# Patient Record
Sex: Female | Born: 1965 | Race: White | Hispanic: No | Smoking: Current every day smoker
Health system: Southern US, Community
[De-identification: ages and names within clinical notes are randomized; demographics above are authoritative.]

## PROBLEM LIST (undated history)

## (undated) DIAGNOSIS — F329 Major depressive disorder, single episode, unspecified: Secondary | ICD-10-CM

## (undated) DIAGNOSIS — E119 Type 2 diabetes mellitus without complications: Secondary | ICD-10-CM

## (undated) DIAGNOSIS — Z720 Tobacco use: Secondary | ICD-10-CM

## (undated) DIAGNOSIS — F419 Anxiety disorder, unspecified: Secondary | ICD-10-CM

## (undated) DIAGNOSIS — F32A Depression, unspecified: Secondary | ICD-10-CM

## (undated) DIAGNOSIS — E079 Disorder of thyroid, unspecified: Secondary | ICD-10-CM

## (undated) DIAGNOSIS — I10 Essential (primary) hypertension: Secondary | ICD-10-CM

## (undated) HISTORY — PX: HYSTEROSCOPY: SHX211

## (undated) HISTORY — PX: TONSILLECTOMY: SUR1361

## (undated) HISTORY — PX: CYSTOSCOPY W/ DILATION OF BLADDER: SUR374

## (undated) HISTORY — PX: ENDOMETRIAL ABLATION: SHX621

---

## 2003-03-16 ENCOUNTER — Ambulatory Visit (HOSPITAL_COMMUNITY): Admission: RE | Admit: 2003-03-16 | Discharge: 2003-03-16 | Payer: Self-pay | Admitting: Occupational Therapy

## 2003-03-16 ENCOUNTER — Encounter: Payer: Self-pay | Admitting: Occupational Therapy

## 2003-04-22 ENCOUNTER — Ambulatory Visit (HOSPITAL_COMMUNITY): Admission: RE | Admit: 2003-04-22 | Discharge: 2003-04-22 | Payer: Self-pay | Admitting: Family Medicine

## 2003-04-22 ENCOUNTER — Encounter: Payer: Self-pay | Admitting: Family Medicine

## 2003-09-09 ENCOUNTER — Emergency Department (HOSPITAL_COMMUNITY)
Admission: EM | Admit: 2003-09-09 | Discharge: 2003-09-09 | Payer: Self-pay | Admitting: Anatomic Pathology & Clinical Pathology

## 2006-11-26 ENCOUNTER — Ambulatory Visit (HOSPITAL_COMMUNITY): Admission: RE | Admit: 2006-11-26 | Discharge: 2006-11-26 | Payer: Self-pay | Admitting: Family Medicine

## 2007-02-03 ENCOUNTER — Encounter (INDEPENDENT_AMBULATORY_CARE_PROVIDER_SITE_OTHER): Payer: Self-pay | Admitting: *Deleted

## 2007-02-03 ENCOUNTER — Ambulatory Visit (HOSPITAL_COMMUNITY): Admission: RE | Admit: 2007-02-03 | Discharge: 2007-02-03 | Payer: Self-pay | Admitting: Obstetrics & Gynecology

## 2008-11-01 ENCOUNTER — Ambulatory Visit (HOSPITAL_COMMUNITY): Admission: RE | Admit: 2008-11-01 | Discharge: 2008-11-01 | Payer: Self-pay | Admitting: Pulmonary Disease

## 2008-11-10 ENCOUNTER — Ambulatory Visit (HOSPITAL_COMMUNITY): Admission: RE | Admit: 2008-11-10 | Discharge: 2008-11-10 | Payer: Self-pay | Admitting: Pulmonary Disease

## 2009-08-08 ENCOUNTER — Emergency Department (HOSPITAL_COMMUNITY): Admission: EM | Admit: 2009-08-08 | Discharge: 2009-08-08 | Payer: Self-pay | Admitting: Emergency Medicine

## 2009-09-12 ENCOUNTER — Emergency Department (HOSPITAL_COMMUNITY): Admission: EM | Admit: 2009-09-12 | Discharge: 2009-09-12 | Payer: Self-pay | Admitting: Emergency Medicine

## 2009-12-28 ENCOUNTER — Emergency Department (HOSPITAL_COMMUNITY): Admission: EM | Admit: 2009-12-28 | Discharge: 2009-12-28 | Payer: Self-pay | Admitting: Emergency Medicine

## 2010-03-07 ENCOUNTER — Ambulatory Visit (HOSPITAL_COMMUNITY): Admission: RE | Admit: 2010-03-07 | Discharge: 2010-03-07 | Payer: Self-pay | Admitting: Pulmonary Disease

## 2010-05-26 ENCOUNTER — Emergency Department (HOSPITAL_COMMUNITY): Admission: EM | Admit: 2010-05-26 | Discharge: 2010-05-26 | Payer: Self-pay | Admitting: Emergency Medicine

## 2010-05-29 ENCOUNTER — Ambulatory Visit (HOSPITAL_COMMUNITY): Admission: RE | Admit: 2010-05-29 | Discharge: 2010-05-29 | Payer: Self-pay | Admitting: Pulmonary Disease

## 2010-06-26 ENCOUNTER — Emergency Department (HOSPITAL_COMMUNITY): Admission: EM | Admit: 2010-06-26 | Discharge: 2010-06-26 | Payer: Self-pay | Admitting: Emergency Medicine

## 2010-10-08 ENCOUNTER — Emergency Department (HOSPITAL_COMMUNITY): Admission: EM | Admit: 2010-10-08 | Discharge: 2010-10-08 | Payer: Self-pay | Admitting: Emergency Medicine

## 2011-01-22 ENCOUNTER — Emergency Department (HOSPITAL_COMMUNITY)
Admission: EM | Admit: 2011-01-22 | Discharge: 2011-01-22 | Disposition: A | Discharge status: Home / Self Care | Payer: Self-pay | Attending: Emergency Medicine | Admitting: Emergency Medicine

## 2011-01-22 DIAGNOSIS — M1990 Unspecified osteoarthritis, unspecified site: Secondary | ICD-10-CM | POA: Insufficient documentation

## 2011-01-22 DIAGNOSIS — X58XXXA Exposure to other specified factors, initial encounter: Secondary | ICD-10-CM | POA: Insufficient documentation

## 2011-01-22 DIAGNOSIS — I10 Essential (primary) hypertension: Secondary | ICD-10-CM | POA: Insufficient documentation

## 2011-01-22 DIAGNOSIS — IMO0002 Reserved for concepts with insufficient information to code with codable children: Secondary | ICD-10-CM | POA: Insufficient documentation

## 2011-01-22 DIAGNOSIS — E039 Hypothyroidism, unspecified: Secondary | ICD-10-CM | POA: Insufficient documentation

## 2011-01-22 DIAGNOSIS — M79609 Pain in unspecified limb: Secondary | ICD-10-CM | POA: Insufficient documentation

## 2011-01-22 DIAGNOSIS — G5600 Carpal tunnel syndrome, unspecified upper limb: Secondary | ICD-10-CM | POA: Insufficient documentation

## 2011-01-22 DIAGNOSIS — Z79899 Other long term (current) drug therapy: Secondary | ICD-10-CM | POA: Insufficient documentation

## 2011-01-31 LAB — URINALYSIS, ROUTINE W REFLEX MICROSCOPIC
Glucose, UA: NEGATIVE mg/dL
Hgb urine dipstick: NEGATIVE
Nitrite: NEGATIVE
Protein, ur: NEGATIVE mg/dL
Specific Gravity, Urine: 1.01 (ref 1.005–1.030)
Urobilinogen, UA: 1 mg/dL (ref 0.0–1.0)
pH: 6.5 (ref 5.0–8.0)

## 2011-01-31 LAB — CBC
MCV: 90.9 fL (ref 78.0–100.0)
Platelets: 197 10*3/uL (ref 150–400)
RBC: 4.76 MIL/uL (ref 3.87–5.11)
RDW: 13.1 % (ref 11.5–15.5)

## 2011-01-31 LAB — DIFFERENTIAL
Eosinophils Absolute: 0.1 10*3/uL (ref 0.0–0.7)
Lymphocytes Relative: 28 % (ref 12–46)
Lymphs Abs: 1.8 10*3/uL (ref 0.7–4.0)
Monocytes Absolute: 0.4 10*3/uL (ref 0.1–1.0)

## 2011-01-31 LAB — COMPREHENSIVE METABOLIC PANEL
AST: 19 U/L (ref 0–37)
Albumin: 3.4 g/dL — ABNORMAL LOW (ref 3.5–5.2)
Alkaline Phosphatase: 34 U/L — ABNORMAL LOW (ref 39–117)
BUN: 8 mg/dL (ref 6–23)
Calcium: 8.8 mg/dL (ref 8.4–10.5)
Sodium: 138 mEq/L (ref 135–145)

## 2011-01-31 LAB — PREGNANCY, URINE: Preg Test, Ur: NEGATIVE

## 2011-03-30 NOTE — Op Note (Signed)
NAMEMAYLEY, LISH             ACCOUNT NO.:  [id number]   MEDICAL RECORD NO.:  [id number]          PATIENT TYPE:  AMB   LOCATION:  DAY                           FACILITY:  [facility]   PHYSICIAN:  Lazaro Arms, M.D.   DATE OF BIRTH:  09/29/1966   DATE OF PROCEDURE:  02/03/2007  DATE OF DISCHARGE:                               OPERATIVE REPORT   PREOPERATIVE DIAGNOSES:  1. Menometrorrhagia.  2. Dysmenorrhea.   POSTOPERATIVE DIAGNOSES:  1. Menometrorrhagia.  2. Dysmenorrhea.   PROCEDURE:  Hysteroscopy, D&C, endometrial ablation.   SURGEON:  Dr. Despina Hidden   ANESTHESIA:  General endotracheal.   FINDINGS:  The patient had normal endometrial cavity.  No polyps,  fibroids, or submucosal myomas.   DESCRIPTION OF OPERATION:  The patient was taken to the operating room,  placed in supine position where she underwent general endotracheal  anesthesia, placed in dorsal lithotomy position, prepped and draped in  the usual sterile fashion.  Graves speculum was placed.  The cervix was  grasped.  It was dilated slowly to allow passage of the hysteroscope.  Diagnostic hysteroscopy was performed and was found to be normal.  A  vigorous uterine curettage was performed.  Good uterine cry in all  areas.  ThermaChoice 3 endometrial ablation balloon was used; 21 mL of  D5W was required to maintain a pressure of 192 mmHg.  It was heated to  87 degrees Celsius.  Total therapy time was 9 minutes and 45 seconds.  All the fluid was recovered at the end of the procedure.  The patient  tolerated the procedure well.  She experienced minimal blood loss and  was taken to the recovery room in good, stable condition.  All counts  were correct.      Lazaro Arms, M.D.  Electronically Signed     LHE/MEDQ  D:  02/03/2007  T:  02/03/2007  Job:  [id number]

## 2011-04-06 ENCOUNTER — Other Ambulatory Visit (HOSPITAL_COMMUNITY): Payer: Self-pay | Admitting: Pulmonary Disease

## 2011-04-06 DIAGNOSIS — Z139 Encounter for screening, unspecified: Secondary | ICD-10-CM

## 2011-04-13 ENCOUNTER — Ambulatory Visit (HOSPITAL_COMMUNITY)
Admission: RE | Admit: 2011-04-13 | Discharge: 2011-04-13 | Disposition: A | Discharge status: Home / Self Care | Payer: Medicaid Other | Source: Ambulatory Visit | Attending: Pulmonary Disease | Admitting: Pulmonary Disease

## 2011-04-13 DIAGNOSIS — Z1231 Encounter for screening mammogram for malignant neoplasm of breast: Secondary | ICD-10-CM | POA: Insufficient documentation

## 2011-04-13 DIAGNOSIS — Z139 Encounter for screening, unspecified: Secondary | ICD-10-CM

## 2011-05-09 ENCOUNTER — Other Ambulatory Visit (HOSPITAL_COMMUNITY): Payer: Self-pay | Admitting: Pulmonary Disease

## 2011-05-09 ENCOUNTER — Ambulatory Visit (HOSPITAL_COMMUNITY)
Admission: RE | Admit: 2011-05-09 | Discharge: 2011-05-09 | Disposition: A | Discharge status: Home / Self Care | Payer: Medicaid Other | Source: Ambulatory Visit | Attending: Pulmonary Disease | Admitting: Pulmonary Disease

## 2011-05-09 DIAGNOSIS — M25551 Pain in right hip: Secondary | ICD-10-CM

## 2011-05-09 DIAGNOSIS — M25559 Pain in unspecified hip: Secondary | ICD-10-CM | POA: Insufficient documentation

## 2011-08-12 ENCOUNTER — Emergency Department (HOSPITAL_COMMUNITY): Payer: Medicaid Other

## 2011-08-12 ENCOUNTER — Encounter: Payer: Self-pay | Admitting: *Deleted

## 2011-08-12 ENCOUNTER — Emergency Department (HOSPITAL_COMMUNITY)
Admission: EM | Admit: 2011-08-12 | Discharge: 2011-08-12 | Disposition: A | Discharge status: Home / Self Care | Payer: Medicaid Other | Attending: Emergency Medicine | Admitting: Emergency Medicine

## 2011-08-12 DIAGNOSIS — R059 Cough, unspecified: Secondary | ICD-10-CM | POA: Insufficient documentation

## 2011-08-12 DIAGNOSIS — F17200 Nicotine dependence, unspecified, uncomplicated: Secondary | ICD-10-CM | POA: Insufficient documentation

## 2011-08-12 DIAGNOSIS — R05 Cough: Secondary | ICD-10-CM | POA: Insufficient documentation

## 2011-08-12 DIAGNOSIS — J40 Bronchitis, not specified as acute or chronic: Secondary | ICD-10-CM

## 2011-08-12 DIAGNOSIS — R062 Wheezing: Secondary | ICD-10-CM | POA: Insufficient documentation

## 2011-08-12 MED ORDER — ALBUTEROL SULFATE HFA 108 (90 BASE) MCG/ACT IN AERS
2.0000 | INHALATION_SPRAY | Freq: Once | RESPIRATORY_TRACT | Status: AC
  Administered 2011-08-12: 2 via RESPIRATORY_TRACT
  Filled 2011-08-12: qty 6.7

## 2011-08-12 MED ORDER — AZITHROMYCIN 250 MG PO TABS: 250.0000 mg | ORAL_TABLET | Freq: Every day | ORAL | Status: AC

## 2011-08-12 NOTE — ED Notes (Signed)
Pt reports sinus congestion and productive cough starting 4 days ago

## 2011-08-12 NOTE — ED Provider Notes (Signed)
Scribed for Maria Lennert, MD, the patient was seen in room APA01/APA01 . This chart was scribed by Ellie Lunch. This patient's care was started at 8:51 PM.   CSN: [id number] Arrival date & time: 08/12/2011  7:59 PM  Chief Complaint  Patient presents with  . Cough    (Consider location/radiation/quality/duration/timing/severity/associated sxs/prior treatment) HPI PT seen at 20:51 Maria Vargas is a 45 y.o. female who presents to the Emergency Department complaining of productive cough with associated congestion, wheezing, and SOB starting 4 days ago. Cough is described as productive with yellow phlegm. Pt reports cough has become progressively worse. Pt is a smoker with no hx of emphysema.   Past Medical History  Diagnosis Date  . Asthma     Past Surgical History  Procedure Date  . Tonsillectomy   . Cesarean section   . Endometrial ablation     No family history on file.  History  Substance Use Topics  . Smoking status: Current Everyday Smoker  . Smokeless tobacco: Not on file  . Alcohol Use: No    Review of Systems  Constitutional: Negative for fatigue.  HENT: Positive for congestion. Negative for sinus pressure and ear discharge.   Eyes: Negative for discharge.  Respiratory: Positive for cough.   Cardiovascular: Negative for chest pain.  Gastrointestinal: Negative for abdominal pain and diarrhea.  Genitourinary: Negative for frequency and hematuria.  Musculoskeletal: Negative for back pain.  Skin: Negative for rash.  Neurological: Negative for seizures and headaches.  Hematological: Negative.   Psychiatric/Behavioral: Negative for hallucinations.    Allergies  Augmentin  Home Medications  No current outpatient prescriptions on file.  BP 151/74  Pulse 83  Temp(Src) 98.9 F (37.2 C) (Oral)  Resp 20  Ht 4\' 10"  (1.473 m)  Wt 185 lb (83.915 kg)  BMI 38.66 kg/m2  SpO2 99%  Physical Exam  Nursing note and vitals reviewed. Constitutional: She  appears well-developed and well-nourished. No distress.  HENT:  Head: Normocephalic and atraumatic.  Right Ear: External ear normal.  Left Ear: External ear normal.  Eyes: Conjunctivae are normal. Right eye exhibits no discharge. Left eye exhibits no discharge. No scleral icterus.  Neck: Neck supple. No tracheal deviation present.  Cardiovascular: Normal rate, regular rhythm and intact distal pulses.   Pulmonary/Chest: Effort normal. No stridor. No respiratory distress. She has wheezes (monomal wheezing bilaterally). She has no rales.  Abdominal: Soft. Bowel sounds are normal. She exhibits no distension. There is no tenderness. There is no rebound and no guarding.  Musculoskeletal: She exhibits no edema and no tenderness.  Neurological: She is alert. She has normal strength. No sensory deficit. Cranial nerve deficit:  no gross defecits noted. She exhibits normal muscle tone. She displays no seizure activity. Coordination normal.  Skin: Skin is warm and dry. No rash noted.  Psychiatric: She has a normal mood and affect.   Procedures (including critical care time)  OTHER DATA REVIEWED: Nursing notes, vital signs, and past medical records reviewed.  DIAGNOSTIC STUDIES: Oxygen Saturation is 99% on room air, normal by my interpretation.    LABS / RADIOLOGY:  Dg Chest 2 View  08/12/2011  *RADIOLOGY REPORT*  Clinical Data: Productive cough and wheezing.  CHEST - 2 VIEW  Comparison: None  Findings: The cardiac silhouette, mediastinal and hilar contours are within normal limits.  The lungs are clear except for minimal streaky bibasilar atelectasis.  No effusions or edema.  The bony thorax is intact.  IMPRESSION: Streaky bibasilar atelectasis but no infiltrates  or effusions.  Original Report Authenticated By: P. Loralie Champagne, M.D.    ED COURSE / COORDINATION OF CARE: 20:55 EDP at PT bedside. Discussed imaging results not indicating pneumonia at this time. Discussed plan to discharge with abx and  albuterol inhaler prescriptions and F/u with PCP Dr. Juanetta Gosling.   bronchitis  The chart was scribed for me under my direct supervision.  I personally performed the history, physical, and medical decision making and all procedures in the evaluation of this patient.Maria Lennert, MD 08/12/11 2102

## 2011-08-12 NOTE — ED Notes (Signed)
Pt a/ox4. Resp even and unlabored. NAD at this time. D/C instructions and Rx reviewed with pt. Pt verbalized understanding. Pt ambulated to POV with steady gate and husband to transport home.

## 2011-08-31 ENCOUNTER — Other Ambulatory Visit (HOSPITAL_COMMUNITY): Payer: Self-pay | Admitting: Pulmonary Disease

## 2011-08-31 ENCOUNTER — Ambulatory Visit (HOSPITAL_COMMUNITY)
Admission: RE | Admit: 2011-08-31 | Discharge: 2011-08-31 | Disposition: A | Discharge status: Home / Self Care | Payer: Medicaid Other | Source: Ambulatory Visit | Attending: Pulmonary Disease | Admitting: Pulmonary Disease

## 2011-08-31 DIAGNOSIS — J40 Bronchitis, not specified as acute or chronic: Secondary | ICD-10-CM | POA: Insufficient documentation

## 2011-08-31 DIAGNOSIS — J189 Pneumonia, unspecified organism: Secondary | ICD-10-CM | POA: Insufficient documentation

## 2012-08-05 ENCOUNTER — Other Ambulatory Visit (HOSPITAL_COMMUNITY): Payer: Self-pay | Admitting: Pulmonary Disease

## 2012-08-05 DIAGNOSIS — Z1231 Encounter for screening mammogram for malignant neoplasm of breast: Secondary | ICD-10-CM

## 2012-08-06 ENCOUNTER — Other Ambulatory Visit (HOSPITAL_COMMUNITY): Payer: Self-pay | Admitting: Pulmonary Disease

## 2012-08-06 DIAGNOSIS — Z1231 Encounter for screening mammogram for malignant neoplasm of breast: Secondary | ICD-10-CM

## 2012-08-11 ENCOUNTER — Ambulatory Visit (HOSPITAL_COMMUNITY)
Admission: RE | Admit: 2012-08-11 | Discharge: 2012-08-11 | Disposition: A | Discharge status: Home / Self Care | Payer: Self-pay | Source: Ambulatory Visit | Attending: Pulmonary Disease | Admitting: Pulmonary Disease

## 2012-08-11 DIAGNOSIS — Z1231 Encounter for screening mammogram for malignant neoplasm of breast: Secondary | ICD-10-CM

## 2013-07-17 ENCOUNTER — Other Ambulatory Visit (HOSPITAL_COMMUNITY): Payer: Self-pay | Admitting: Pulmonary Disease

## 2013-07-17 DIAGNOSIS — Z139 Encounter for screening, unspecified: Secondary | ICD-10-CM

## 2013-08-14 ENCOUNTER — Ambulatory Visit (HOSPITAL_COMMUNITY)
Admission: RE | Admit: 2013-08-14 | Discharge: 2013-08-14 | Disposition: A | Discharge status: Home / Self Care | Payer: Medicaid Other | Source: Ambulatory Visit | Attending: Pulmonary Disease | Admitting: Pulmonary Disease

## 2013-08-14 DIAGNOSIS — Z139 Encounter for screening, unspecified: Secondary | ICD-10-CM

## 2013-08-14 DIAGNOSIS — Z1231 Encounter for screening mammogram for malignant neoplasm of breast: Secondary | ICD-10-CM | POA: Insufficient documentation

## 2014-04-29 ENCOUNTER — Emergency Department (HOSPITAL_COMMUNITY)
Admission: EM | Admit: 2014-04-29 | Discharge: 2014-04-29 | Disposition: A | Discharge status: Home / Self Care | Payer: Medicaid Other | Attending: Emergency Medicine | Admitting: Emergency Medicine

## 2014-04-29 ENCOUNTER — Encounter (HOSPITAL_COMMUNITY): Payer: Self-pay | Admitting: Emergency Medicine

## 2014-04-29 DIAGNOSIS — R1110 Vomiting, unspecified: Secondary | ICD-10-CM | POA: Insufficient documentation

## 2014-04-29 DIAGNOSIS — J029 Acute pharyngitis, unspecified: Secondary | ICD-10-CM | POA: Insufficient documentation

## 2014-04-29 DIAGNOSIS — J45909 Unspecified asthma, uncomplicated: Secondary | ICD-10-CM | POA: Insufficient documentation

## 2014-04-29 DIAGNOSIS — Z79899 Other long term (current) drug therapy: Secondary | ICD-10-CM | POA: Insufficient documentation

## 2014-04-29 DIAGNOSIS — F17200 Nicotine dependence, unspecified, uncomplicated: Secondary | ICD-10-CM | POA: Insufficient documentation

## 2014-04-29 MED ORDER — CEPHALEXIN 500 MG PO CAPS
500.0000 mg | ORAL_CAPSULE | Freq: Once | ORAL | Status: AC
  Administered 2014-04-29: 500 mg via ORAL
  Filled 2014-04-29: qty 1

## 2014-04-29 MED ORDER — ACETAMINOPHEN 500 MG PO TABS
1000.0000 mg | ORAL_TABLET | Freq: Once | ORAL | Status: AC
  Administered 2014-04-29: 1000 mg via ORAL
  Filled 2014-04-29: qty 2

## 2014-04-29 MED ORDER — CEPHALEXIN 500 MG PO CAPS: 500.0000 mg | ORAL_CAPSULE | Freq: Four times a day (QID) | ORAL | Status: DC

## 2014-04-29 NOTE — ED Provider Notes (Signed)
CSN: [id number]     Arrival date & time 04/29/14  2016 History   First MD Initiated Contact with Patient 04/29/14 2129     Chief Complaint  Patient presents with  . Sore Throat     (Consider location/radiation/quality/duration/timing/severity/associated sxs/prior Treatment) HPI Comments: Patient is a 48 year old female who presents to the emergency department with complaint of sore throat. The patient states that this started about 3 days ago. She has had some low-grade temperature elevations, a few chills, and mild abdomen pain, but no rash or other symptoms. The patient states that she was exposed to strep as one of the family members had strep and she was around and did not know that she was carrying strep infection.  The patient also wanted to past about uncontrolled gagging and at times vomiting. The patient states that sometimes when she smells certain things or sometimes even when she eats different things that she has gagging, and or vomiting. She's not had any injury to the abdomen. She's not had any recent surgical procedures. At times the problem occurs when she smells things, and at times it occurs after she has eaten. There is no particular pattern to this nausea, vomiting, or gagging. There's been no blood involved.  Patient is a 48 y.o. female presenting with pharyngitis. The history is provided by the patient.  Sore Throat Associated symptoms include a sore throat and vomiting. Pertinent negatives include no abdominal pain, arthralgias, chest pain, coughing or neck pain.    Past Medical History  Diagnosis Date  . Asthma    Past Surgical History  Procedure Laterality Date  . Tonsillectomy    . Cesarean section    . Endometrial ablation     History reviewed. No pertinent family history. History  Substance Use Topics  . Smoking status: Current Every Day Smoker  . Smokeless tobacco: Not on file  . Alcohol Use: No   OB History   Grav Para Term Preterm Abortions TAB SAB  Ect Mult Living                 Review of Systems  Constitutional: Negative for activity change.       All ROS Neg except as noted in HPI  HENT: Positive for sore throat. Negative for nosebleeds.   Eyes: Negative for photophobia and discharge.  Respiratory: Negative for cough, shortness of breath and wheezing.   Cardiovascular: Negative for chest pain and palpitations.  Gastrointestinal: Positive for vomiting. Negative for abdominal pain and blood in stool.  Genitourinary: Negative for dysuria, frequency and hematuria.  Musculoskeletal: Negative for arthralgias, back pain and neck pain.  Skin: Negative.   Neurological: Negative for dizziness, seizures and speech difficulty.  Psychiatric/Behavioral: Negative for hallucinations and confusion.      Allergies  Amoxicillin-pot clavulanate  Home Medications   Prior to Admission medications   Medication Sig Start Date End Date Taking? Authorizing Provider  Cyanocobalamin (B-12 PO) Take 1 tablet by mouth daily.   Yes Historical Provider, MD  Fish Oil-Cholecalciferol (FISH OIL + D3 PO) Take 1 capsule by mouth daily.   Yes Historical Provider, MD  levothyroxine (SYNTHROID, LEVOTHROID) 50 MCG tablet Take 50 mcg by mouth daily.     Yes Historical Provider, MD  megestrol (MEGACE) 40 MG tablet Take 40 mg by mouth daily.   Yes Historical Provider, MD  sertraline (ZOLOFT) 100 MG tablet Take 100 mg by mouth daily.   Yes Historical Provider, MD  spironolactone (ALDACTONE) 25 MG tablet Take 25 mg  by mouth daily.     Yes Historical Provider, MD   BP 155/88  Pulse 104  Temp(Src) 99.1 F (37.3 C) (Oral)  Resp 20  Ht 4\' 10"  (1.473 m)  Wt 218 lb 9 oz (99.139 kg)  BMI 45.69 kg/m2  SpO2 98% Physical Exam  Nursing note and vitals reviewed. Constitutional: She is oriented to person, place, and time. She appears well-developed and well-nourished.  Non-toxic appearance.  HENT:  Head: Normocephalic.  Right Ear: Tympanic membrane and external ear  normal.  Left Ear: Tympanic membrane and external ear normal.  There is increased redness and some swelling of the uvula. There is increased redness of the posterior pharynx. The airway is patent.  The tympanic membranes are without problem.  Eyes: EOM and lids are normal. Pupils are equal, round, and reactive to light.  Neck: Normal range of motion. Neck supple. Carotid bruit is not present.  Cardiovascular: Normal rate, regular rhythm, normal heart sounds, intact distal pulses and normal pulses.   Pulmonary/Chest: Breath sounds normal. No respiratory distress.  Abdominal: Soft. Bowel sounds are normal. There is no tenderness. There is no guarding.  Musculoskeletal: Normal range of motion.  Lymphadenopathy:       Head (right side): No submandibular adenopathy present.       Head (left side): No submandibular adenopathy present.    She has no cervical adenopathy.  Neurological: She is alert and oriented to person, place, and time. She has normal strength. No cranial nerve deficit or sensory deficit.  Skin: Skin is warm and dry. No rash noted.  Psychiatric: She has a normal mood and affect. Her speech is normal.    ED Course  Procedures (including critical care time) Labs Review Labs Reviewed - No data to display  Imaging Review No results found.   EKG Interpretation None      MDM Patient has increased redness of the posterior pharynx, there is some swelling of the uvula. The airway is patent. The patient will be treated with Keflex 4 times a day. Patient will also be advised to use salt water gargles.  The patient's abdomen is soft with good bowel sounds. No evidence of a surgical abdomen at this time. I have advised the patient to keep a journal of her vomiting episodes, along with what she has been exposed to either pass female or things that she has eaten open her mouth. Her father*to present this information in detail to her physicians so that the proper workup can begin.     Final diagnoses:  None    **I have reviewed nursing notes, vital signs, and all appropriate lab and imaging results for this patient.Kathie Dike*    Hobson M Bryant, PA-C 04/29/14 2145

## 2014-04-29 NOTE — ED Notes (Signed)
Pt states she has been gagging and throwing up a little at random for the last month and that she was exposed to strep throat on Monday. She states that her throat is hurting but denies fever greater than 99.

## 2014-04-29 NOTE — ED Provider Notes (Signed)
Medical screening examination/treatment/procedure(s) were performed by non-physician practitioner and as supervising physician I was immediately available for consultation/collaboration.   EKG Interpretation None      Devoria AlbeIva Knapp, MD, Armando GangFACEP   Ward GivensIva L Knapp, MD 04/29/14 2300

## 2014-04-29 NOTE — Discharge Instructions (Signed)
Please use salt water gargles 3-4 times daily. Please wash hands frequently. Please use Keflex 4 times daily with food. Please keep a list of the vomiting episodes, please also keep a list of what you are exposed to at that time whether it be smells, something major eating, or other changes such as sleep, rest, stress, etc. Please present this information to your physician for appropriate workup. Pharyngitis Pharyngitis is a sore throat (pharynx). There is redness, pain, and swelling of your throat. HOME CARE   Drink enough fluids to keep your pee (urine) clear or pale yellow.  Only take medicine as told by your doctor.  You may get sick again if you do not take medicine as told. Finish your medicines, even if you start to feel better.  Do not take aspirin.  Rest.  Rinse your mouth (gargle) with salt water ( tsp of salt per 1 qt of water) every 1-2 hours. This will help the pain.  If you are not at risk for choking, you can suck on hard candy or sore throat lozenges. GET HELP IF:  You have large, tender lumps on your neck.  You have a rash.  You cough up green, yellow-brown, or bloody spit. GET HELP RIGHT AWAY IF:   You have a stiff neck.  You drool or cannot swallow liquids.  You throw up (vomit) or are not able to keep medicine or liquids down.  You have very bad pain that does not go away with medicine.  You have problems breathing (not from a stuffy nose). MAKE SURE YOU:   Understand these instructions.  Will watch your condition.  Will get help right away if you are not doing well or get worse. Document Released: 04/16/2008 Document Revised: 08/19/2013 Document Reviewed: 07/06/2013 [facility] Patient Information 2015 [employer], [place]. This information is not intended to replace advice given to you by your health care provider. Make sure you discuss any questions you have with your health care provider.

## 2014-04-29 NOTE — ED Notes (Signed)
Uncontrollable gagging for the past few months and I will vomit for no reason. Monday I was exposed to strep throat per pt.

## 2015-02-24 ENCOUNTER — Ambulatory Visit (HOSPITAL_COMMUNITY)
Admission: RE | Admit: 2015-02-24 | Discharge: 2015-02-24 | Disposition: A | Discharge status: Home / Self Care | Payer: Medicaid Other | Source: Ambulatory Visit | Attending: Pulmonary Disease | Admitting: Pulmonary Disease

## 2015-02-24 ENCOUNTER — Other Ambulatory Visit (HOSPITAL_COMMUNITY): Payer: Self-pay | Admitting: Pulmonary Disease

## 2015-02-24 DIAGNOSIS — M542 Cervicalgia: Secondary | ICD-10-CM

## 2016-10-31 ENCOUNTER — Encounter (HOSPITAL_COMMUNITY): Payer: Self-pay | Admitting: *Deleted

## 2016-10-31 ENCOUNTER — Emergency Department (HOSPITAL_COMMUNITY): Payer: Self-pay

## 2016-10-31 ENCOUNTER — Emergency Department (HOSPITAL_COMMUNITY)
Admission: EM | Admit: 2016-10-31 | Discharge: 2016-10-31 | Disposition: A | Discharge status: Home / Self Care | Payer: Self-pay | Attending: Emergency Medicine | Admitting: Emergency Medicine

## 2016-10-31 DIAGNOSIS — F17200 Nicotine dependence, unspecified, uncomplicated: Secondary | ICD-10-CM | POA: Insufficient documentation

## 2016-10-31 DIAGNOSIS — J45909 Unspecified asthma, uncomplicated: Secondary | ICD-10-CM | POA: Insufficient documentation

## 2016-10-31 DIAGNOSIS — I10 Essential (primary) hypertension: Secondary | ICD-10-CM | POA: Insufficient documentation

## 2016-10-31 DIAGNOSIS — Y999 Unspecified external cause status: Secondary | ICD-10-CM | POA: Insufficient documentation

## 2016-10-31 DIAGNOSIS — Y9301 Activity, walking, marching and hiking: Secondary | ICD-10-CM | POA: Insufficient documentation

## 2016-10-31 DIAGNOSIS — S92425A Nondisplaced fracture of distal phalanx of left great toe, initial encounter for closed fracture: Secondary | ICD-10-CM | POA: Insufficient documentation

## 2016-10-31 DIAGNOSIS — W1839XA Other fall on same level, initial encounter: Secondary | ICD-10-CM | POA: Insufficient documentation

## 2016-10-31 DIAGNOSIS — Z79899 Other long term (current) drug therapy: Secondary | ICD-10-CM | POA: Insufficient documentation

## 2016-10-31 DIAGNOSIS — Y929 Unspecified place or not applicable: Secondary | ICD-10-CM | POA: Insufficient documentation

## 2016-10-31 HISTORY — DX: Disorder of thyroid, unspecified: E07.9

## 2016-10-31 HISTORY — DX: Essential (primary) hypertension: I10

## 2016-10-31 MED ORDER — OXYCODONE-ACETAMINOPHEN 5-325 MG PO TABS
1.0000 | ORAL_TABLET | Freq: Once | ORAL | Status: AC
  Administered 2016-10-31: 1 via ORAL
  Filled 2016-10-31: qty 1

## 2016-10-31 MED ORDER — NAPROXEN 250 MG PO TABS
500.0000 mg | ORAL_TABLET | Freq: Once | ORAL | Status: AC
  Administered 2016-10-31: 500 mg via ORAL
  Filled 2016-10-31: qty 2

## 2016-10-31 MED ORDER — NAPROXEN 500 MG PO TABS: ORAL_TABLET | ORAL | 0 refills | Status: DC

## 2016-10-31 MED ORDER — OXYCODONE-ACETAMINOPHEN 5-325 MG PO TABS: 1.0000 | ORAL_TABLET | Freq: Four times a day (QID) | ORAL | 0 refills | Status: DC | PRN

## 2016-10-31 NOTE — ED Notes (Signed)
Awaiting dc papers from the doctor at this time

## 2016-10-31 NOTE — ED Triage Notes (Signed)
Pt was walking outside yesterday, stepped in a hole causing her left great toe to bend backwards, left great toe bruised, swollen,

## 2016-10-31 NOTE — ED Provider Notes (Signed)
[facility]-EMERGENCY DEPT Provider Note   CSN: [id number] Arrival date & time: 10/31/16  0217   Time seen 2:40 AM  History   Chief Complaint Chief Complaint  Patient presents with  . Toe Injury    HPI Maria Vargas is a 50 y.o. female.  HPI patient states about 4 PM she was walking and didn't realize there is a ditch until she stepped into it and she fell forward. She states the ditch was full of leaves and when she stepped she thought it was level ground and she fell into the hole. She states she hurt her left great toe. She states the pain is getting worse as time is gone. Denies injuring anything else other than having a superficial abrasion of her left knee.   PCP Dr Juanetta GoslingHawkins  Past Medical History:  Diagnosis Date  . Asthma   . Hypertension   . Thyroid disease     There are no active problems to display for this patient.   Past Surgical History:  Procedure Laterality Date  . CESAREAN SECTION    . ENDOMETRIAL ABLATION    . TONSILLECTOMY      OB History    No data available       Home Medications    Prior to Admission medications   Medication Sig Start Date End Date Taking? Authorizing Provider  Fish Oil-Cholecalciferol (FISH OIL + D3 PO) Take 1 capsule by mouth daily.   Yes Historical Provider, MD  levothyroxine (SYNTHROID, LEVOTHROID) 50 MCG tablet Take 50 mcg by mouth daily.     Yes Historical Provider, MD  megestrol (MEGACE) 40 MG tablet Take 20 mg by mouth daily.    Yes Historical Provider, MD  sertraline (ZOLOFT) 100 MG tablet Take 200 mg by mouth daily.    Yes Historical Provider, MD  spironolactone (ALDACTONE) 25 MG tablet Take 25 mg by mouth daily.     Yes Historical Provider, MD  cephALEXin (KEFLEX) 500 MG capsule Take 1 capsule (500 mg total) by mouth 4 (four) times daily. 04/29/14   Ivery QualeHobson Bryant, PA-C  Cyanocobalamin (B-12 PO) Take 1 tablet by mouth daily.    Historical Provider, MD  naproxen (NAPROSYN) 500 MG tablet Take 1 po BID with food prn  pain 10/31/16   Devoria AlbeIva Jariana Shumard, MD  oxyCODONE-acetaminophen (PERCOCET/ROXICET) 5-325 MG tablet Take 1 tablet by mouth every 6 (six) hours as needed for severe pain. 10/31/16   Devoria AlbeIva Brekyn Huntoon, MD    Family History No family history on file.  Social History Social History  Substance Use Topics  . Smoking status: Current Every Day Smoker  . Smokeless tobacco: Never Used  . Alcohol use No  Unemployed   Allergies   Amoxicillin-pot clavulanate   Review of Systems Review of Systems  All other systems reviewed and are negative.    Physical Exam Updated Vital Signs BP 158/77 (BP Location: Right Arm)   Pulse 100   Temp 97.6 F (36.4 C) (Oral)   Resp 20   SpO2 97%   Vital signs normal    Physical Exam  Constitutional: She appears well-developed and well-nourished.  HENT:  Head: Normocephalic and atraumatic.  Right Ear: External ear normal.  Left Ear: External ear normal.  Nose: Nose normal.  Eyes: Conjunctivae and EOM are normal.  Neck: Neck supple.  Cardiovascular: Normal rate.   Pulmonary/Chest: Effort normal. No respiratory distress.  Musculoskeletal: She exhibits edema and tenderness.  Patient is noted to have diffuse swelling and some bruising of her left  great toe especially just distal to the IP joint.     ED Treatments / Results    Radiology Dg Foot Complete Left  Result Date: 10/31/2016 CLINICAL DATA:  50 year old female with pain in the left great toe. EXAM: LEFT FOOT - COMPLETE 3+ VIEW COMPARISON:  None. FINDINGS: There is a nondisplaced fracture of the dorsal corner of the base of the distal phalanx of the great toe. There is extension of the fracture into the interphalangeal articular surface. No other acute fracture identified. There is no dislocation. No arthritic changes. The bones are well mineralized. There is soft tissue swelling of the fifth toe. No radiopaque foreign object IMPRESSION: Nondisplaced inter articular fracture of the dorsal aspect of the base  of the distal phalanx of the great toe. Electronically Signed   By: Elgie CollardArash  Radparvar M.D.   On: 10/31/2016 02:58    Procedures Procedures (including critical care time)  Medications Ordered in ED Medications  oxyCODONE-acetaminophen (PERCOCET/ROXICET) 5-325 MG per tablet 1 tablet (1 tablet Oral Given 10/31/16 0346)  naproxen (NAPROSYN) tablet 500 mg (500 mg Oral Given 10/31/16 0347)     Initial Impression / Assessment and Plan / ED Course  I have reviewed the triage vital signs and the nursing notes.  Pertinent labs & imaging results that were available during my care of the patient were reviewed by me and considered in my medical decision making (see chart for details).  Clinical Course    Patient had x-ray obtained of her left great toe. She is noted to have a fracture. We reviewed her x-rays. Patient was given oral pain medication and her toes were buddy taped. She was placed in a postop shoe. She did require crutches for comfort.   [place] shows no entries in the past 6 months  Final Clinical Impressions(s) / ED Diagnoses   Final diagnoses:  Nondisplaced fracture of distal phalanx of left great toe, initial encounter for closed fracture    New Prescriptions New Prescriptions   NAPROXEN (NAPROSYN) 500 MG TABLET    Take 1 po BID with food prn pain   OXYCODONE-ACETAMINOPHEN (PERCOCET/ROXICET) 5-325 MG TABLET    Take 1 tablet by mouth every 6 (six) hours as needed for severe pain.    Plan discharge  Devoria AlbeIva Leiyah Maultsby, MD, Concha PyoFACEP    Miro Balderson, MD 10/31/16 [phone]

## 2016-10-31 NOTE — Discharge Instructions (Addendum)
Elevate your foot. Use the ice packs for comfort and to reduce swelling. Take the medications as prescribed. I am limited by law how many pain pills I can prescribe, you will need to see your PCP or the orthopedist if you need more. Wear the post op shoe for comfort. Keep the toes buddy taped to support the fractured toe. Use the crutches until you are able to walk in the postop shoe without them. Call Dr Harrison's office to be rechecked in the next week.

## 2017-11-07 ENCOUNTER — Encounter (HOSPITAL_COMMUNITY): Payer: Self-pay

## 2017-11-07 ENCOUNTER — Emergency Department (HOSPITAL_COMMUNITY)
Admission: EM | Admit: 2017-11-07 | Discharge: 2017-11-07 | Disposition: A | Discharge status: Home / Self Care | Payer: Self-pay | Attending: Emergency Medicine | Admitting: Emergency Medicine

## 2017-11-07 ENCOUNTER — Other Ambulatory Visit: Payer: Self-pay

## 2017-11-07 DIAGNOSIS — F17200 Nicotine dependence, unspecified, uncomplicated: Secondary | ICD-10-CM | POA: Insufficient documentation

## 2017-11-07 DIAGNOSIS — J45909 Unspecified asthma, uncomplicated: Secondary | ICD-10-CM | POA: Insufficient documentation

## 2017-11-07 DIAGNOSIS — I10 Essential (primary) hypertension: Secondary | ICD-10-CM | POA: Insufficient documentation

## 2017-11-07 DIAGNOSIS — Z79899 Other long term (current) drug therapy: Secondary | ICD-10-CM | POA: Insufficient documentation

## 2017-11-07 DIAGNOSIS — M25572 Pain in left ankle and joints of left foot: Secondary | ICD-10-CM | POA: Insufficient documentation

## 2017-11-07 HISTORY — DX: Depression, unspecified: F32.A

## 2017-11-07 HISTORY — DX: Anxiety disorder, unspecified: F41.9

## 2017-11-07 HISTORY — DX: Major depressive disorder, single episode, unspecified: F32.9

## 2017-11-07 MED ORDER — COLCHICINE 0.6 MG PO TABS
1.2000 mg | ORAL_TABLET | Freq: Once | ORAL | Status: AC
  Administered 2017-11-07: 1.2 mg via ORAL

## 2017-11-07 MED ORDER — COLCHICINE 0.6 MG PO TABS: 0.6000 mg | ORAL_TABLET | Freq: Two times a day (BID) | ORAL | 0 refills | Status: DC | PRN

## 2017-11-07 MED ORDER — COLCHICINE 0.6 MG PO TABS
ORAL_TABLET | ORAL | Status: AC
  Filled 2017-11-07: qty 2

## 2017-11-07 NOTE — ED Provider Notes (Signed)
[facility] EMERGENCY DEPARTMENT Provider Note   CSN: [id number] Arrival date & time: 11/07/17  1423     History   Chief Complaint Chief Complaint  Patient presents with  . Ankle Pain    HPI Maria Vargas is a 51 y.o. female.  HPI  The patient is a 51 year old female, she has no prior history of joint arthropathy other than osteoarthritis of the knees.  She presents with left-sided ankle pain which started yesterday and was mild, pain is mostly over the medial left ankle but this has worsened over the last 24 hours and now she has diffuse ankle pain, pain with range of motion and ambulation with mild swelling, mild warmth but no fevers or history of injury.  She has not had any specific medications prior to arrival.  She does report that it is better when she is off the ankle, worse when she touches it or moves it  Past Medical History:  Diagnosis Date  . Anxiety   . Asthma   . Depression   . Hypertension   . Thyroid disease     There are no active problems to display for this patient.   Past Surgical History:  Procedure Laterality Date  . CESAREAN SECTION    . ENDOMETRIAL ABLATION    . TONSILLECTOMY      OB History    No data available       Home Medications    Prior to Admission medications   Medication Sig Start Date End Date Taking? Authorizing Provider  cephALEXin (KEFLEX) 500 MG capsule Take 1 capsule (500 mg total) by mouth 4 (four) times daily. 04/29/14   Ivery QualeBryant, Hobson, PA-C  colchicine 0.6 MG tablet Take 1 tablet (0.6 mg total) by mouth 2 (two) times daily as needed for up to 10 doses. 11/07/17   Eber HongMiller, Shukri Nistler, MD  Cyanocobalamin (B-12 PO) Take 1 tablet by mouth daily.    [provider]  Fish Oil-Cholecalciferol (FISH OIL + D3 PO) Take 1 capsule by mouth daily.    [provider]  levothyroxine (SYNTHROID, LEVOTHROID) 50 MCG tablet Take 50 mcg by mouth daily.      [provider]  megestrol (MEGACE) 40 MG tablet Take  20 mg by mouth daily.     [provider]  naproxen (NAPROSYN) 500 MG tablet Take 1 po BID with food prn pain 10/31/16   Devoria AlbeKnapp, Iva, MD  oxyCODONE-acetaminophen (PERCOCET/ROXICET) 5-325 MG tablet Take 1 tablet by mouth every 6 (six) hours as needed for severe pain. 10/31/16   Devoria AlbeKnapp, Iva, MD  sertraline (ZOLOFT) 100 MG tablet Take 200 mg by mouth daily.     [provider]  spironolactone (ALDACTONE) 25 MG tablet Take 25 mg by mouth daily.      [provider]    Family History No family history on file.  Social History Social History   Tobacco Use  . Smoking status: Current Every Day Smoker  . Smokeless tobacco: Never Used  Substance Use Topics  . Alcohol use: No  . Drug use: Not on file     Allergies   Amoxicillin-pot clavulanate   Review of Systems Review of Systems  Constitutional: Negative for fever.  Musculoskeletal: Positive for joint swelling.     Physical Exam Updated Vital Signs BP (!) 185/86 (BP Location: Right Arm)   Pulse 96   Temp 98.3 F (36.8 C) (Oral)   Resp 17   Ht 4\' 10"  (1.473 m)   Wt 98.9  kg (218 lb)   SpO2 99%   BMI 45.56 kg/m   Physical Exam  Constitutional: She appears well-developed and well-nourished.  HENT:  Head: Normocephalic and atraumatic.  Eyes: Conjunctivae are normal. Right eye exhibits no discharge. Left eye exhibits no discharge.  Pulmonary/Chest: Effort normal. No respiratory distress.  Musculoskeletal: She exhibits tenderness.  Decreased range of motion of the left ankle secondary to tenderness, minimal warmth over the medial ankle otherwise normal-appearing foot and ankle  Neurological: She is alert. Coordination normal.  Skin: Skin is warm and dry. No rash noted. She is not diaphoretic. No erythema.  Psychiatric: She has a normal mood and affect.  Nursing note and vitals reviewed.    ED Treatments / Results  Labs (all labs ordered are listed, but only abnormal results are displayed) Labs  Reviewed - No data to display  EKG  EKG Interpretation None       Radiology No results found.  Procedures Procedures (including critical care time)  Medications Ordered in ED Medications  colchicine tablet 1.2 mg (not administered)  colchicine 0.6 MG tablet (not administered)     Initial Impression / Assessment and Plan / ED Course  I have reviewed the triage vital signs and the nursing notes.  Pertinent labs & imaging results that were available during my care of the patient were reviewed by me and considered in my medical decision making (see chart for details).     Likely gout, patient is hypertensive, informed that she needs to have this rechecked at her doctor's office within 2 weeks.  She will get the first dose of colchicine, I have given her the precautions and follow-up instructions regarding this medication and this illness which I think is likely gout, doubt septic arthritis.  Final Clinical Impressions(s) / ED Diagnoses   Final diagnoses:  Acute left ankle pain    ED Discharge Orders        Ordered    colchicine 0.6 MG tablet  2 times daily PRN     11/07/17 1442       Eber HongMiller, Itzy Adler, MD 11/07/17 1445

## 2017-11-07 NOTE — Discharge Instructions (Signed)
You may take colchicine, 0.6 mg as needed twice a day for pain or swelling.  Return to the emergency department for increased pain redness fever or swelling.  This may take several days to go away, please stay off your foot as much as possible

## 2017-11-07 NOTE — ED Triage Notes (Signed)
Left ankle pain/swelling x2 days. No know injury noted. Patient ambulatory to triage.

## 2017-11-07 NOTE — ED Notes (Signed)
Patient given discharge instruction, verbalized understand. Patient ambulatory out of the department.  

## 2017-12-02 ENCOUNTER — Encounter (HOSPITAL_COMMUNITY): Payer: Self-pay | Admitting: *Deleted

## 2017-12-02 ENCOUNTER — Other Ambulatory Visit: Payer: Self-pay

## 2017-12-02 ENCOUNTER — Emergency Department (HOSPITAL_COMMUNITY)
Admission: EM | Admit: 2017-12-02 | Discharge: 2017-12-02 | Disposition: A | Discharge status: Home / Self Care | Payer: Self-pay | Attending: Emergency Medicine | Admitting: Emergency Medicine

## 2017-12-02 DIAGNOSIS — I10 Essential (primary) hypertension: Secondary | ICD-10-CM | POA: Insufficient documentation

## 2017-12-02 DIAGNOSIS — J45909 Unspecified asthma, uncomplicated: Secondary | ICD-10-CM | POA: Insufficient documentation

## 2017-12-02 DIAGNOSIS — Z79899 Other long term (current) drug therapy: Secondary | ICD-10-CM | POA: Insufficient documentation

## 2017-12-02 DIAGNOSIS — M779 Enthesopathy, unspecified: Secondary | ICD-10-CM | POA: Insufficient documentation

## 2017-12-02 DIAGNOSIS — F17200 Nicotine dependence, unspecified, uncomplicated: Secondary | ICD-10-CM | POA: Insufficient documentation

## 2017-12-02 MED ORDER — IBUPROFEN 600 MG PO TABS: 600.0000 mg | ORAL_TABLET | Freq: Four times a day (QID) | ORAL | 0 refills | Status: DC

## 2017-12-02 MED ORDER — DEXAMETHASONE 4 MG PO TABS: 4.0000 mg | ORAL_TABLET | Freq: Two times a day (BID) | ORAL | 0 refills | Status: DC

## 2017-12-02 MED ORDER — KETOROLAC TROMETHAMINE 10 MG PO TABS
10.0000 mg | ORAL_TABLET | Freq: Once | ORAL | Status: AC
  Administered 2017-12-02: 10 mg via ORAL
  Filled 2017-12-02: qty 1

## 2017-12-02 MED ORDER — ACETAMINOPHEN 325 MG PO TABS
650.0000 mg | ORAL_TABLET | Freq: Once | ORAL | Status: AC
  Administered 2017-12-02: 650 mg via ORAL
  Filled 2017-12-02: qty 2

## 2017-12-02 NOTE — Discharge Instructions (Signed)
Your examination is consistent with tendinitis involving your thumb and wrist.  Please use the thumb spica splint.  Please use ibuprofen and Decadron daily with food.  Please see Dr. Romeo AppleHarrison for orthopedic evaluation if not improving.

## 2017-12-02 NOTE — ED Triage Notes (Signed)
Bilateral wrist pain 

## 2017-12-02 NOTE — ED Provider Notes (Signed)
[facility] EMERGENCY DEPARTMENT Provider Note   CSN: [id number] Arrival date & time: 12/02/17  1443     History   Chief Complaint Chief Complaint  Patient presents with  . Wrist Pain    HPI Maria Vargas is a 52 y.o. female.  Patient is a 52 year old female who presents to the emergency department with a complaint of bilateral wrist pain.  The patient states that this problem is been going off and on for about 3 weeks.  It is gotten worse within the last week.  The patient states she is not had injury to her fingers or her wrist, but has pain with certain movement particularly of her thumb.  She denies injury.  She states however that she does a lot of work with texting and she does a lot of use of her thumbs with a tablet.  She also states that she does a lot of lifting of her very small grandchildren.  The pain has gotten progressively worse and so she presents to the emergency department for evaluation of this pain.      Past Medical History:  Diagnosis Date  . Anxiety   . Asthma   . Depression   . Hypertension   . Thyroid disease     There are no active problems to display for this patient.   Past Surgical History:  Procedure Laterality Date  . CESAREAN SECTION    . ENDOMETRIAL ABLATION    . TONSILLECTOMY      OB History    No data available       Home Medications    Prior to Admission medications   Medication Sig Start Date End Date Taking? Authorizing Provider  cephALEXin (KEFLEX) 500 MG capsule Take 1 capsule (500 mg total) by mouth 4 (four) times daily. 04/29/14   Ivery Quale, PA-C  colchicine 0.6 MG tablet Take 1 tablet (0.6 mg total) by mouth 2 (two) times daily as needed for up to 10 doses. 11/07/17   Eber Hong, MD  Cyanocobalamin (B-12 PO) Take 1 tablet by mouth daily.    [provider]  dexamethasone (DECADRON) 4 MG tablet Take 1 tablet (4 mg total) by mouth 2 (two) times daily with a meal. 12/02/17   Ivery Quale, PA-C    Fish Oil-Cholecalciferol (FISH OIL + D3 PO) Take 1 capsule by mouth daily.    [provider]  ibuprofen (ADVIL,MOTRIN) 600 MG tablet Take 1 tablet (600 mg total) by mouth 4 (four) times daily. 12/02/17   Ivery Quale, PA-C  levothyroxine (SYNTHROID, LEVOTHROID) 50 MCG tablet Take 50 mcg by mouth daily.      [provider]  megestrol (MEGACE) 40 MG tablet Take 20 mg by mouth daily.     [provider]  naproxen (NAPROSYN) 500 MG tablet Take 1 po BID with food prn pain 10/31/16   Devoria Albe, MD  oxyCODONE-acetaminophen (PERCOCET/ROXICET) 5-325 MG tablet Take 1 tablet by mouth every 6 (six) hours as needed for severe pain. 10/31/16   Devoria Albe, MD  sertraline (ZOLOFT) 100 MG tablet Take 200 mg by mouth daily.     [provider]  spironolactone (ALDACTONE) 25 MG tablet Take 25 mg by mouth daily.      [provider]    Family History No family history on file.  Social History Social History   Tobacco Use  . Smoking status: Current Every Day Smoker  . Smokeless tobacco: Never Used  Substance Use Topics  . Alcohol  use: No  . Drug use: Not on file     Allergies   Amoxicillin-pot clavulanate   Review of Systems Review of Systems  Constitutional: Negative for activity change.       All ROS Neg except as noted in HPI  HENT: Negative for nosebleeds.   Eyes: Negative for photophobia and discharge.  Respiratory: Negative for cough, shortness of breath and wheezing.   Cardiovascular: Negative for chest pain and palpitations.  Gastrointestinal: Negative for abdominal pain and blood in stool.  Genitourinary: Negative for dysuria, frequency and hematuria.  Musculoskeletal: Positive for arthralgias. Negative for back pain and neck pain.  Skin: Negative.   Neurological: Negative for dizziness, seizures and speech difficulty.  Psychiatric/Behavioral: Negative for confusion and hallucinations.     Physical Exam Updated Vital Signs Ht 4'  10" (1.473 m)   Wt 98 kg (216 lb)   BMI 45.14 kg/m   Physical Exam  Constitutional: She is oriented to person, place, and time. She appears well-developed and well-nourished.  Non-toxic appearance.  HENT:  Head: Normocephalic.  Right Ear: Tympanic membrane and external ear normal.  Left Ear: Tympanic membrane and external ear normal.  Eyes: EOM and lids are normal. Pupils are equal, round, and reactive to light.  Neck: Normal range of motion. Neck supple. Carotid bruit is not present.  Cardiovascular: Normal rate, regular rhythm, normal heart sounds, intact distal pulses and normal pulses.  Pulmonary/Chest: Breath sounds normal. No respiratory distress.  Abdominal: Soft. Bowel sounds are normal. There is no tenderness. There is no guarding.  Musculoskeletal: Normal range of motion.  Capillary refill is less than 2 seconds bilaterally.  There is good range of motion of the fingers.  There is pain with flexion and extension of the thumb extending to the wrist and to the lower portion of the forearm.  No hot joints appreciated.  No areas of increased redness.  Pain can be reproduced with palpation and with flexion and extension of the thumb and wrist left greater than right.  Lymphadenopathy:       Head (right side): No submandibular adenopathy present.       Head (left side): No submandibular adenopathy present.    She has no cervical adenopathy.  Neurological: She is alert and oriented to person, place, and time. She has normal strength. No cranial nerve deficit or sensory deficit.  Skin: Skin is warm and dry.  Psychiatric: She has a normal mood and affect. Her speech is normal.  Nursing note and vitals reviewed.    ED Treatments / Results  Labs (all labs ordered are listed, but only abnormal results are displayed) Labs Reviewed - No data to display  EKG  EKG Interpretation None       Radiology No results found.  Procedures Procedures (including critical care  time)  Medications Ordered in ED Medications  ketorolac (TORADOL) tablet 10 mg (not administered)  acetaminophen (TYLENOL) tablet 650 mg (not administered)     Initial Impression / Assessment and Plan / ED Course  I have reviewed the triage vital signs and the nursing notes.  Pertinent labs & imaging results that were available during my care of the patient were reviewed by me and considered in my medical decision making (see chart for details).       Final Clinical Impressions(s) / ED Diagnoses MDM Patient states that she does a lot of work with her cell phone with texting as well as with her tablet.  She also lives 3 very small  grandbabies frequently.  Her examination is consistent with tendinitis.  The patient will be treated with a short course of steroid and anti-inflammatory pain medication.  I provided the patient with a thumb spica splint for the left side.  The patient is to follow-up with orthopedics if not improving.  The patient is in agreement with this plan.   Final diagnoses:  Tendonitis    ED Discharge Orders        Ordered    ibuprofen (ADVIL,MOTRIN) 600 MG tablet  4 times daily     12/02/17 1628    dexamethasone (DECADRON) 4 MG tablet  2 times daily with meals     12/02/17 1628       Ivery QualeBryant, Rashad Auld, PA-C 12/02/17 1710    Doug SouJacubowitz, Sam, MD 12/03/17 [phone]

## 2018-02-12 ENCOUNTER — Other Ambulatory Visit (HOSPITAL_COMMUNITY): Payer: Self-pay | Admitting: Pulmonary Disease

## 2018-02-12 ENCOUNTER — Ambulatory Visit (HOSPITAL_COMMUNITY)
Admission: RE | Admit: 2018-02-12 | Discharge: 2018-02-12 | Disposition: A | Discharge status: Home / Self Care | Payer: Self-pay | Source: Ambulatory Visit | Attending: Pulmonary Disease | Admitting: Pulmonary Disease

## 2018-02-12 DIAGNOSIS — M25532 Pain in left wrist: Secondary | ICD-10-CM | POA: Insufficient documentation

## 2018-02-12 DIAGNOSIS — M25531 Pain in right wrist: Secondary | ICD-10-CM | POA: Insufficient documentation

## 2018-03-21 ENCOUNTER — Emergency Department (HOSPITAL_COMMUNITY): Payer: Self-pay

## 2018-03-21 ENCOUNTER — Other Ambulatory Visit: Payer: Self-pay

## 2018-03-21 ENCOUNTER — Encounter (HOSPITAL_COMMUNITY): Payer: Self-pay | Admitting: *Deleted

## 2018-03-21 ENCOUNTER — Emergency Department (HOSPITAL_COMMUNITY)
Admission: EM | Admit: 2018-03-21 | Discharge: 2018-03-21 | Disposition: A | Discharge status: Home / Self Care | Payer: Self-pay | Attending: Emergency Medicine | Admitting: Emergency Medicine

## 2018-03-21 DIAGNOSIS — Z79899 Other long term (current) drug therapy: Secondary | ICD-10-CM | POA: Insufficient documentation

## 2018-03-21 DIAGNOSIS — I10 Essential (primary) hypertension: Secondary | ICD-10-CM | POA: Insufficient documentation

## 2018-03-21 DIAGNOSIS — J45909 Unspecified asthma, uncomplicated: Secondary | ICD-10-CM | POA: Insufficient documentation

## 2018-03-21 DIAGNOSIS — F17210 Nicotine dependence, cigarettes, uncomplicated: Secondary | ICD-10-CM | POA: Insufficient documentation

## 2018-03-21 DIAGNOSIS — L03116 Cellulitis of left lower limb: Secondary | ICD-10-CM | POA: Insufficient documentation

## 2018-03-21 LAB — CBC WITH DIFFERENTIAL/PLATELET
Basophils Absolute: 0 10*3/uL (ref 0.0–0.1)
Basophils Relative: 0 %
EOS ABS: 0.2 10*3/uL (ref 0.0–0.7)
EOS PCT: 1 %
HEMATOCRIT: 40.7 % (ref 36.0–46.0)
Hemoglobin: 13.6 g/dL (ref 12.0–15.0)
Lymphocytes Relative: 14 %
Lymphs Abs: 3 10*3/uL (ref 0.7–4.0)
MCH: 30 pg (ref 26.0–34.0)
MCHC: 33.4 g/dL (ref 30.0–36.0)
MCV: 89.6 fL (ref 78.0–100.0)
MONO ABS: 1.4 10*3/uL — AB (ref 0.1–1.0)
MONOS PCT: 6 %
Neutro Abs: 17.6 10*3/uL — ABNORMAL HIGH (ref 1.7–7.7)
Neutrophils Relative %: 79 %
Platelets: 229 10*3/uL (ref 150–400)
RBC: 4.54 MIL/uL (ref 3.87–5.11)
RDW: 13.5 % (ref 11.5–15.5)
WBC: 22.2 10*3/uL — ABNORMAL HIGH (ref 4.0–10.5)

## 2018-03-21 LAB — I-STAT BETA HCG BLOOD, ED (MC, WL, AP ONLY)

## 2018-03-21 LAB — BASIC METABOLIC PANEL
Anion gap: 8 (ref 5–15)
BUN: 9 mg/dL (ref 6–20)
CO2: 25 mmol/L (ref 22–32)
CREATININE: 0.6 mg/dL (ref 0.44–1.00)
Calcium: 8.6 mg/dL — ABNORMAL LOW (ref 8.9–10.3)
Chloride: 101 mmol/L (ref 101–111)
GFR calc Af Amer: >60 mL/min (ref >60)
GFR calc non Af Amer: >60 mL/min (ref >60)
GLUCOSE: 166 mg/dL — AB (ref 65–99)
Potassium: 3.7 mmol/L (ref 3.5–5.1)
Sodium: 134 mmol/L — ABNORMAL LOW (ref 135–145)

## 2018-03-21 MED ORDER — CLINDAMYCIN PHOSPHATE 600 MG/50ML IV SOLN
600.0000 mg | Freq: Once | INTRAVENOUS | Status: AC
  Administered 2018-03-21: 600 mg via INTRAVENOUS
  Filled 2018-03-21: qty 50

## 2018-03-21 MED ORDER — IOPAMIDOL (ISOVUE-300) INJECTION 61%
100.0000 mL | Freq: Once | INTRAVENOUS | Status: AC | PRN
  Administered 2018-03-21: 100 mL via INTRAVENOUS

## 2018-03-21 MED ORDER — KETOROLAC TROMETHAMINE 30 MG/ML IJ SOLN
30.0000 mg | Freq: Once | INTRAMUSCULAR | Status: AC
  Administered 2018-03-21: 30 mg via INTRAVENOUS
  Filled 2018-03-21: qty 1

## 2018-03-21 MED ORDER — CLINDAMYCIN HCL 300 MG PO CAPS: 300.0000 mg | ORAL_CAPSULE | Freq: Four times a day (QID) | ORAL | 0 refills | Status: DC

## 2018-03-21 NOTE — ED Provider Notes (Addendum)
[facility] EMERGENCY DEPARTMENT Provider Note   CSN: [id number] Arrival date & time: 03/21/18  1002     History   Chief Complaint Chief Complaint  Patient presents with  . Abscess    HPI Maria Vargas is a 52 y.o. female with a 2 day history of worsening redness in the left groin which began as a small pimple.  She denies drainage from the site and denies fevers, chills or other complaint.  She has had no tx prior to arrival and denies prior similar sx.  Pain is aching and sore, worse with palpation and movement. She has found no alleviators.  The history is provided by the patient.    Past Medical History:  Diagnosis Date  . Anxiety   . Asthma   . Depression   . Hypertension   . Thyroid disease     There are no active problems to display for this patient.   Past Surgical History:  Procedure Laterality Date  . CESAREAN SECTION    . CYSTOSCOPY W/ DILATION OF BLADDER     x2  . ENDOMETRIAL ABLATION    . HYSTEROSCOPY    . TONSILLECTOMY       OB History   None      Home Medications    Prior to Admission medications   Medication Sig Start Date End Date Taking? Authorizing Provider  Fish Oil-Cholecalciferol (FISH OIL + D3 PO) Take 1 capsule by mouth daily.   Yes [provider]  levothyroxine (SYNTHROID, LEVOTHROID) 50 MCG tablet Take 50 mcg by mouth daily.     Yes [provider]  megestrol (MEGACE) 40 MG tablet Take 20 mg by mouth daily.    Yes [provider]  Multiple Vitamins-Minerals (MULTIVITAMIN ADULT PO) Take 1 tablet by mouth daily.    Yes [provider]  predniSONE (DELTASONE) 10 MG tablet Take 10-40 mg by mouth daily. For 3 days take 4 tablets by mouth, for 3 days take 3 tablets by mouth, for 3 days take 2 tablets by mouth, and for 3 days take 1 tablet by mouth 03/11/18  Yes [provider]  sertraline (ZOLOFT) 100 MG tablet Take 200 mg by mouth daily.    Yes [provider]  spironolactone  (ALDACTONE) 25 MG tablet Take 25 mg by mouth daily.     Yes [provider]  clindamycin (CLEOCIN) 300 MG capsule Take 1 capsule (300 mg total) by mouth 4 (four) times daily. 03/21/18   Burgess Amor, PA-C  dexamethasone (DECADRON) 4 MG tablet Take 1 tablet (4 mg total) by mouth 2 (two) times daily with a meal. 12/02/17   Ivery Quale, PA-C  ibuprofen (ADVIL,MOTRIN) 600 MG tablet Take 1 tablet (600 mg total) by mouth 4 (four) times daily. 12/02/17   Ivery Quale, PA-C    Family History No family history on file.  Social History Social History   Tobacco Use  . Smoking status: Current Every Day Smoker    Packs/day: 1.00    Types: Cigarettes  . Smokeless tobacco: Never Used  Substance Use Topics  . Alcohol use: Yes    Comment: rarely  . Drug use: Never     Allergies   Amoxicillin-pot clavulanate   Review of Systems Review of Systems  Constitutional: Negative for fever.  HENT: Negative for congestion and sore throat.   Eyes: Negative.   Respiratory: Negative for chest tightness and shortness of breath.   Cardiovascular: Negative for chest pain.  Gastrointestinal: Negative for  abdominal pain and nausea.  Genitourinary: Negative.   Musculoskeletal: Negative for arthralgias, joint swelling and neck pain.  Skin: Positive for color change and wound. Negative for rash.  Neurological: Negative for dizziness, weakness, light-headedness, numbness and headaches.  Psychiatric/Behavioral: Negative.      Physical Exam Updated Vital Signs BP (!) 142/77   Pulse 96   Temp 98.9 F (37.2 C) (Oral)   Resp 18   SpO2 94%   Physical Exam  Constitutional: She appears well-developed and well-nourished.  HENT:  Head: Normocephalic and atraumatic.  Eyes: Conjunctivae are normal.  Neck: Normal range of motion.  Cardiovascular: Normal rate and intact distal pulses.  Pulmonary/Chest: Effort normal.  Abdominal: Soft. There is no tenderness.  Musculoskeletal: Normal range of  motion.  Neurological: She is alert.  Skin: Skin is warm and dry. There is erythema.  14 x 14 cm area of erythema left upper thigh with a small non draining papule with surrounding induration.  No fluctuance.  Not involving labia or perineum.   Psychiatric: She has a normal mood and affect.  Nursing note and vitals reviewed.    ED Treatments / Results  Labs (all labs ordered are listed, but only abnormal results are displayed) Labs Reviewed  BASIC METABOLIC PANEL - Abnormal; Notable for the following components:      Result Value   Sodium 134 (*)    Glucose, Bld 166 (*)    Calcium 8.6 (*)    All other components within normal limits  CBC WITH DIFFERENTIAL/PLATELET - Abnormal; Notable for the following components:   WBC 22.2 (*)    Neutro Abs 17.6 (*)    Monocytes Absolute 1.4 (*)    All other components within normal limits  I-STAT BETA HCG BLOOD, ED ([facility], [facility], [facility] ONLY)    EKG None  Radiology Ct Extremity Lower Left W Contrast  Result Date: 03/21/2018 CLINICAL DATA:  Pain and swelling along the medial aspect of the left thigh for 2 days. Question abscess. EXAM: CT OF THE LOWER LEFT EXTREMITY WITH CONTRAST TECHNIQUE: Multidetector CT imaging of the lower left extremity was performed according to the standard protocol following intravenous contrast administration. COMPARISON:  None. CONTRAST:  100 ml ISOVUE-300 IOPAMIDOL (ISOVUE-300) INJECTION 61% FINDINGS: Bones/Joint/Cartilage Normal. No fracture, periosteal reaction, bony destructive change or focal lesion. Ligaments Normal. Muscles and Tendons Normal. Fat planes are preserved. No intramuscular edema or fluid collection. Soft tissues There is stranding in subcutaneous fat of the medial left thigh and cutaneous tissues are thickened. Imaged intrapelvic contents appear normal. IMPRESSION: Findings consistent with cellulitis of the medial left thigh. Negative for abscess, osteomyelitis or myositis. Electronically Signed   By: Drusilla Kanner M.D.   On: 03/21/2018 13:31    Procedures Procedures (including critical care time)  Medications Ordered in ED Medications  clindamycin (CLEOCIN) IVPB 600 mg (0 mg Intravenous Stopped 03/21/18 1225)  ketorolac (TORADOL) 30 MG/ML injection 30 mg (30 mg Intravenous Given 03/21/18 1155)  iopamidol (ISOVUE-300) 61 % injection 100 mL (100 mLs Intravenous Contrast Given 03/21/18 1313)     Initial Impression / Assessment and Plan / ED Course  I have reviewed the triage vital signs and the nursing notes.  Pertinent labs & imaging results that were available during my care of the patient were reviewed by me and considered in my medical decision making (see chart for details).     Pt just completed course of prednisone for an ortho issue ytd, explains at least partially leukocytosis.  Imaging reviewed  and discussed with pt. No abscess.  Pt would prefer op abx and close f/u. Will return here tomorrow for recheck.  Infection edges marked. Pt seen by Dr  Estell Harpin prior to dc.   Final Clinical Impressions(s) / ED Diagnoses   Final diagnoses:  Cellulitis of left lower extremity    ED Discharge Orders        Ordered    clindamycin (CLEOCIN) 300 MG capsule  4 times daily     03/21/18 1355       Burgess Amor, PA-C 03/21/18 1400    Burgess Amor, PA-C 03/21/18 1401    Bethann Berkshire, MD 03/21/18 1453

## 2018-03-21 NOTE — ED Notes (Signed)
Pt reports abscess to her L groin X2 days, diffuse redness to inner thigh. Open and draining at this time. Pt had one episode of V/ yesterday. Denies N/D/, fever, or chills at this time.

## 2018-03-21 NOTE — ED Triage Notes (Signed)
Pt c/o abscess to left inner thigh x 2 days with swelling and redness. Pt has red streaking down the left thigh. Pt reports nausea and headache that started yesterday.

## 2018-03-21 NOTE — Discharge Instructions (Addendum)
Return for a recheck of your infection as discussed in 24 hours.  Take 2 additional doses of the antibiotic prescribed today.

## 2018-03-22 ENCOUNTER — Inpatient Hospital Stay (HOSPITAL_COMMUNITY)
Admission: EM | Admit: 2018-03-22 | Discharge: 2018-03-26 | DRG: 872 | Disposition: A | Discharge status: Home / Self Care | Payer: Self-pay | Attending: Pulmonary Disease | Admitting: Pulmonary Disease

## 2018-03-22 ENCOUNTER — Other Ambulatory Visit: Payer: Self-pay

## 2018-03-22 ENCOUNTER — Encounter (HOSPITAL_COMMUNITY): Payer: Self-pay

## 2018-03-22 DIAGNOSIS — I10 Essential (primary) hypertension: Secondary | ICD-10-CM | POA: Diagnosis present

## 2018-03-22 DIAGNOSIS — A419 Sepsis, unspecified organism: Principal | ICD-10-CM | POA: Diagnosis present

## 2018-03-22 DIAGNOSIS — E1165 Type 2 diabetes mellitus with hyperglycemia: Secondary | ICD-10-CM | POA: Diagnosis present

## 2018-03-22 DIAGNOSIS — Z880 Allergy status to penicillin: Secondary | ICD-10-CM

## 2018-03-22 DIAGNOSIS — L03116 Cellulitis of left lower limb: Secondary | ICD-10-CM | POA: Diagnosis present

## 2018-03-22 DIAGNOSIS — L03119 Cellulitis of unspecified part of limb: Secondary | ICD-10-CM

## 2018-03-22 DIAGNOSIS — F17210 Nicotine dependence, cigarettes, uncomplicated: Secondary | ICD-10-CM | POA: Diagnosis present

## 2018-03-22 DIAGNOSIS — Z6841 Body Mass Index (BMI) 40.0 and over, adult: Secondary | ICD-10-CM

## 2018-03-22 DIAGNOSIS — E039 Hypothyroidism, unspecified: Secondary | ICD-10-CM | POA: Diagnosis present

## 2018-03-22 DIAGNOSIS — D72829 Elevated white blood cell count, unspecified: Secondary | ICD-10-CM

## 2018-03-22 DIAGNOSIS — L02419 Cutaneous abscess of limb, unspecified: Secondary | ICD-10-CM

## 2018-03-22 DIAGNOSIS — Z79890 Hormone replacement therapy (postmenopausal): Secondary | ICD-10-CM

## 2018-03-22 DIAGNOSIS — L02416 Cutaneous abscess of left lower limb: Secondary | ICD-10-CM | POA: Diagnosis present

## 2018-03-22 DIAGNOSIS — L0390 Cellulitis, unspecified: Secondary | ICD-10-CM | POA: Diagnosis present

## 2018-03-22 DIAGNOSIS — E669 Obesity, unspecified: Secondary | ICD-10-CM | POA: Diagnosis present

## 2018-03-22 DIAGNOSIS — F329 Major depressive disorder, single episode, unspecified: Secondary | ICD-10-CM | POA: Diagnosis present

## 2018-03-22 DIAGNOSIS — J45909 Unspecified asthma, uncomplicated: Secondary | ICD-10-CM | POA: Diagnosis present

## 2018-03-22 DIAGNOSIS — R739 Hyperglycemia, unspecified: Secondary | ICD-10-CM

## 2018-03-22 LAB — COMPREHENSIVE METABOLIC PANEL
ALT: 18 U/L (ref 14–54)
ANION GAP: 7 (ref 5–15)
AST: 15 U/L (ref 15–41)
Albumin: 3 g/dL — ABNORMAL LOW (ref 3.5–5.0)
Alkaline Phosphatase: 41 U/L (ref 38–126)
BUN: 9 mg/dL (ref 6–20)
CHLORIDE: 105 mmol/L (ref 101–111)
CO2: 25 mmol/L (ref 22–32)
Calcium: 9.1 mg/dL (ref 8.9–10.3)
Creatinine, Ser: 0.57 mg/dL (ref 0.44–1.00)
Glucose, Bld: 230 mg/dL — ABNORMAL HIGH (ref 65–99)
POTASSIUM: 3.5 mmol/L (ref 3.5–5.1)
Sodium: 137 mmol/L (ref 135–145)
Total Bilirubin: 0.8 mg/dL (ref 0.3–1.2)
Total Protein: 6.7 g/dL (ref 6.5–8.1)

## 2018-03-22 LAB — GLUCOSE, CAPILLARY
Glucose-Capillary: 152 mg/dL — ABNORMAL HIGH (ref 65–99)
Glucose-Capillary: 173 mg/dL — ABNORMAL HIGH (ref 65–99)

## 2018-03-22 LAB — URINALYSIS, ROUTINE W REFLEX MICROSCOPIC
BACTERIA UA: NONE SEEN
Bilirubin Urine: NEGATIVE
Glucose, UA: >=500 mg/dL — AB
KETONES UR: NEGATIVE mg/dL
Leukocytes, UA: NEGATIVE
Nitrite: NEGATIVE
PH: 6 (ref 5.0–8.0)
Protein, ur: NEGATIVE mg/dL
SPECIFIC GRAVITY, URINE: 1.021 (ref 1.005–1.030)

## 2018-03-22 LAB — CBC WITH DIFFERENTIAL/PLATELET
Basophils Absolute: 0 10*3/uL (ref 0.0–0.1)
Basophils Relative: 0 %
Eosinophils Absolute: 0.2 10*3/uL (ref 0.0–0.7)
Eosinophils Relative: 1 %
HCT: 40.9 % (ref 36.0–46.0)
HEMOGLOBIN: 13.6 g/dL (ref 12.0–15.0)
LYMPHS ABS: 2.2 10*3/uL (ref 0.7–4.0)
LYMPHS PCT: 11 %
MCH: 30 pg (ref 26.0–34.0)
MCHC: 33.3 g/dL (ref 30.0–36.0)
MCV: 90.3 fL (ref 78.0–100.0)
Monocytes Absolute: 1 10*3/uL (ref 0.1–1.0)
Monocytes Relative: 5 %
NEUTROS ABS: 16 10*3/uL — AB (ref 1.7–7.7)
NEUTROS PCT: 83 %
Platelets: 231 10*3/uL (ref 150–400)
RBC: 4.53 MIL/uL (ref 3.87–5.11)
RDW: 13.6 % (ref 11.5–15.5)
WBC: 19.5 10*3/uL — AB (ref 4.0–10.5)

## 2018-03-22 LAB — I-STAT CG4 LACTIC ACID, ED: LACTIC ACID, VENOUS: 1.49 mmol/L (ref 0.5–1.9)

## 2018-03-22 LAB — HEMOGLOBIN A1C
Hgb A1c MFr Bld: 7.6 % — ABNORMAL HIGH (ref 4.8–5.6)
Mean Plasma Glucose: 171.42 mg/dL

## 2018-03-22 MED ORDER — PIPERACILLIN-TAZOBACTAM 3.375 G IVPB 30 MIN
3.3750 g | Freq: Once | INTRAVENOUS | Status: AC
  Administered 2018-03-22: 3.375 g via INTRAVENOUS
  Filled 2018-03-22: qty 50

## 2018-03-22 MED ORDER — ACETAMINOPHEN 325 MG PO TABS: 650.0000 mg | ORAL_TABLET | Freq: Four times a day (QID) | ORAL | Status: DC | PRN

## 2018-03-22 MED ORDER — MEGESTROL ACETATE 40 MG PO TABS
20.0000 mg | ORAL_TABLET | Freq: Every day | ORAL | Status: DC
  Administered 2018-03-23 – 2018-03-26 (×4): 20 mg via ORAL
  Filled 2018-03-22 (×2): qty 1
  Filled 2018-03-22 (×2): qty 0.5

## 2018-03-22 MED ORDER — HYDROCODONE-ACETAMINOPHEN 5-325 MG PO TABS: 1.0000 | ORAL_TABLET | ORAL | Status: DC | PRN

## 2018-03-22 MED ORDER — LEVOTHYROXINE SODIUM 50 MCG PO TABS
50.0000 ug | ORAL_TABLET | Freq: Every day | ORAL | Status: DC
  Administered 2018-03-23 – 2018-03-26 (×4): 50 ug via ORAL
  Filled 2018-03-22 (×4): qty 1

## 2018-03-22 MED ORDER — ACETAMINOPHEN 650 MG RE SUPP: 650.0000 mg | Freq: Four times a day (QID) | RECTAL | Status: DC | PRN

## 2018-03-22 MED ORDER — LIDOCAINE HCL (PF) 1 % IJ SOLN
5.0000 mL | Freq: Once | INTRAMUSCULAR | Status: AC
Start: 2018-03-22 — End: 2018-03-22
  Administered 2018-03-22: 5 mL via INTRADERMAL

## 2018-03-22 MED ORDER — VANCOMYCIN HCL IN DEXTROSE 1-5 GM/200ML-% IV SOLN
1000.0000 mg | Freq: Three times a day (TID) | INTRAVENOUS | Status: DC
  Administered 2018-03-22 – 2018-03-25 (×9): 1000 mg via INTRAVENOUS
  Filled 2018-03-22 (×8): qty 200

## 2018-03-22 MED ORDER — INSULIN ASPART 100 UNIT/ML ~~LOC~~ SOLN
0.0000 [IU] | Freq: Three times a day (TID) | SUBCUTANEOUS | Status: DC
  Administered 2018-03-22: 2 [IU] via SUBCUTANEOUS
  Administered 2018-03-23 – 2018-03-24 (×5): 1 [IU] via SUBCUTANEOUS
  Administered 2018-03-25: 2 [IU] via SUBCUTANEOUS
  Administered 2018-03-25 (×2): 1 [IU] via SUBCUTANEOUS
  Administered 2018-03-26: 2 [IU] via SUBCUTANEOUS

## 2018-03-22 MED ORDER — SODIUM CHLORIDE 0.9 % IV SOLN
1000.0000 mL | INTRAVENOUS | Status: DC
  Administered 2018-03-22: 1000 mL via INTRAVENOUS

## 2018-03-22 MED ORDER — LIDOCAINE HCL (PF) 1 % IJ SOLN
INTRAMUSCULAR | Status: AC
  Administered 2018-03-22: 5 mL via INTRADERMAL
  Filled 2018-03-22: qty 10

## 2018-03-22 MED ORDER — ENOXAPARIN SODIUM 40 MG/0.4ML ~~LOC~~ SOLN
40.0000 mg | Freq: Every day | SUBCUTANEOUS | Status: DC
  Administered 2018-03-22 – 2018-03-26 (×5): 40 mg via SUBCUTANEOUS
  Filled 2018-03-22 (×5): qty 0.4

## 2018-03-22 MED ORDER — PIPERACILLIN-TAZOBACTAM 3.375 G IVPB
3.3750 g | Freq: Three times a day (TID) | INTRAVENOUS | Status: DC
  Administered 2018-03-22 – 2018-03-25 (×11): 3.375 g via INTRAVENOUS
  Filled 2018-03-22 (×10): qty 50

## 2018-03-22 MED ORDER — SODIUM CHLORIDE 0.9 % IV SOLN
INTRAVENOUS | Status: DC
  Administered 2018-03-22 – 2018-03-25 (×6): via INTRAVENOUS

## 2018-03-22 MED ORDER — SPIRONOLACTONE 25 MG PO TABS
25.0000 mg | ORAL_TABLET | Freq: Every day | ORAL | Status: DC
  Administered 2018-03-23 – 2018-03-26 (×4): 25 mg via ORAL
  Filled 2018-03-22 (×4): qty 1

## 2018-03-22 MED ORDER — KETOROLAC TROMETHAMINE 30 MG/ML IJ SOLN
30.0000 mg | Freq: Four times a day (QID) | INTRAMUSCULAR | Status: AC
  Administered 2018-03-22 – 2018-03-24 (×8): 30 mg via INTRAVENOUS
  Filled 2018-03-22 (×8): qty 1

## 2018-03-22 MED ORDER — SERTRALINE HCL 50 MG PO TABS
200.0000 mg | ORAL_TABLET | Freq: Every day | ORAL | Status: DC
  Administered 2018-03-23 – 2018-03-26 (×4): 200 mg via ORAL
  Filled 2018-03-22 (×4): qty 4

## 2018-03-22 MED ORDER — VANCOMYCIN HCL IN DEXTROSE 1-5 GM/200ML-% IV SOLN
1000.0000 mg | Freq: Once | INTRAVENOUS | Status: AC
  Administered 2018-03-22: 1000 mg via INTRAVENOUS
  Filled 2018-03-22: qty 200

## 2018-03-22 NOTE — ED Provider Notes (Signed)
[facility] EMERGENCY DEPARTMENT Provider Note   CSN: [id number] Arrival date & time: 03/22/18  5621     History   Chief Complaint No chief complaint on file.   HPI Maria Vargas is a 52 y.o. female.  HPI  The patient is a 52 year old female, she denies having diabetes but has been on prednisone recently because of an inflammatory condition of her hands.  She had presented to the emergency department yesterday where she had a CT scan of her thigh performed showing cellulitis but no signs of abscess, she had a white blood cell count of over 20,000 and had evidence of a cellulitis of the left inner proximal thigh.  She was given intravenous clindamycin, she was offered admission to the hospital but the patient declined at that time, a outline was drawn around the area of cellulitis and the patient states that over the last 12 hours this is rapidly progressed and is now much more tender, painful, red and is causing her to feel chills and diaphoresis.  She has been able to eat and drink and has been taking her medications but states it continues to get worse.  Symptoms are now severe.  Past Medical History:  Diagnosis Date  . Anxiety   . Asthma   . Depression   . Hypertension   . Thyroid disease     Patient Active Problem List   Diagnosis Date Noted  . Cellulitis 03/22/2018    Past Surgical History:  Procedure Laterality Date  . CESAREAN SECTION    . CYSTOSCOPY W/ DILATION OF BLADDER     x2  . ENDOMETRIAL ABLATION    . HYSTEROSCOPY    . TONSILLECTOMY       OB History   None      Home Medications    Prior to Admission medications   Medication Sig Start Date End Date Taking? Authorizing Provider  clindamycin (CLEOCIN) 300 MG capsule Take 1 capsule (300 mg total) by mouth 4 (four) times daily. 03/21/18   Burgess Amor, PA-C  dexamethasone (DECADRON) 4 MG tablet Take 1 tablet (4 mg total) by mouth 2 (two) times daily with a meal. 12/02/17   Ivery Quale, PA-C  Fish  Oil-Cholecalciferol (FISH OIL + D3 PO) Take 1 capsule by mouth daily.    [provider]  ibuprofen (ADVIL,MOTRIN) 600 MG tablet Take 1 tablet (600 mg total) by mouth 4 (four) times daily. 12/02/17   Ivery Quale, PA-C  levothyroxine (SYNTHROID, LEVOTHROID) 50 MCG tablet Take 50 mcg by mouth daily.      [provider]  megestrol (MEGACE) 40 MG tablet Take 20 mg by mouth daily.     [provider]  Multiple Vitamins-Minerals (MULTIVITAMIN ADULT PO) Take 1 tablet by mouth daily.     [provider]  predniSONE (DELTASONE) 10 MG tablet Take 10-40 mg by mouth daily. For 3 days take 4 tablets by mouth, for 3 days take 3 tablets by mouth, for 3 days take 2 tablets by mouth, and for 3 days take 1 tablet by mouth 03/11/18   [provider]  sertraline (ZOLOFT) 100 MG tablet Take 200 mg by mouth daily.     [provider]  spironolactone (ALDACTONE) 25 MG tablet Take 25 mg by mouth daily.      [provider]    Family History No family history on file.  Social History Social History   Tobacco Use  . Smoking status: Current Every Day Smoker  Packs/day: 1.00    Types: Cigarettes  . Smokeless tobacco: Never Used  Substance Use Topics  . Alcohol use: Yes    Comment: rarely  . Drug use: Never     Allergies   Amoxicillin-pot clavulanate   Review of Systems Review of Systems  All other systems reviewed and are negative.    Physical Exam Updated Vital Signs BP (!) 164/73 (BP Location: Left Arm) Comment: Simultaneous filing. User may not have seen previous data.  Pulse (!) 104   Temp 98.8 F (37.1 C)   Resp (!) 22   Ht  (1.473 m)   Wt 98 kg (216 lb)   SpO2 96%   BMI 45.14 kg/m   Physical Exam  Constitutional: She appears well-developed and well-nourished. No distress.  Uncomfortable appearing  HENT:  Head: Normocephalic and atraumatic.  Mouth/Throat: Oropharynx is clear and moist. No oropharyngeal exudate.    Eyes: Pupils are equal, round, and reactive to light. Conjunctivae and EOM are normal. Right eye exhibits no discharge. Left eye exhibits no discharge. No scleral icterus.  Neck: Normal range of motion. Neck supple. No JVD present. No thyromegaly present.  Cardiovascular: Regular rhythm, normal heart sounds and intact distal pulses. Exam reveals no gallop and no friction rub.  No murmur heard. Tachycardia  Pulmonary/Chest: Effort normal and breath sounds normal. No respiratory distress. She has no wheezes. She has no rales.  Abdominal: Soft. Bowel sounds are normal. She exhibits no distension and no mass. There is no tenderness.  Musculoskeletal: Normal range of motion. She exhibits tenderness. She exhibits no edema.  Lymphadenopathy:    She has no cervical adenopathy.  Neurological: She is alert. Coordination normal.  Skin: Skin is warm and dry. Rash noted. No erythema.  The patient has a large area of her left inner proximal thigh extending onto the anterior thigh and up towards the inguinal crease which is indurated and erythematous.  There is no focal areas of fluctuance.  She has lymphadenopathy of the left inguinal region.  Psychiatric: She has a normal mood and affect. Her behavior is normal.  Nursing note and vitals reviewed.    ED Treatments / Results  Labs (all labs ordered are listed, but only abnormal results are displayed) Labs Reviewed  COMPREHENSIVE METABOLIC PANEL - Abnormal; Notable for the following components:      Result Value   Glucose, Bld 230 (*)    Albumin 3.0 (*)    All other components within normal limits  CBC WITH DIFFERENTIAL/PLATELET - Abnormal; Notable for the following components:   WBC 19.5 (*)    Neutro Abs 16.0 (*)    All other components within normal limits  URINALYSIS, ROUTINE W REFLEX MICROSCOPIC - Abnormal; Notable for the following components:   APPearance HAZY (*)    Glucose, UA >=500 (*)    Hgb urine dipstick SMALL (*)    All other  components within normal limits  CULTURE, BLOOD (ROUTINE X 2)  CULTURE, BLOOD (ROUTINE X 2)  I-STAT CG4 LACTIC ACID, ED    EKG EKG Interpretation  Date/Time:  Saturday Mar 22 2018 09:13:31 EDT Ventricular Rate:  104 PR Interval:    QRS Duration: 95 QT Interval:  353 QTC Calculation: 465 R Axis:   -24 Text Interpretation:  Sinus tachycardia Borderline left axis deviation Baseline wander in lead(s) V1 Since last tracing rate faster Confirmed by Eber Hong ([id number]) on 03/22/2018 9:23:09 AM   Radiology Ct Extremity Lower Left W Contrast  Result Date: 03/21/2018 CLINICAL  DATA:  Pain and swelling along the medial aspect of the left thigh for 2 days. Question abscess. EXAM: CT OF THE LOWER LEFT EXTREMITY WITH CONTRAST TECHNIQUE: Multidetector CT imaging of the lower left extremity was performed according to the standard protocol following intravenous contrast administration. COMPARISON:  None. CONTRAST:  100 ml ISOVUE-300 IOPAMIDOL (ISOVUE-300) INJECTION 61% FINDINGS: Bones/Joint/Cartilage Normal. No fracture, periosteal reaction, bony destructive change or focal lesion. Ligaments Normal. Muscles and Tendons Normal. Fat planes are preserved. No intramuscular edema or fluid collection. Soft tissues There is stranding in subcutaneous fat of the medial left thigh and cutaneous tissues are thickened. Imaged intrapelvic contents appear normal. IMPRESSION: Findings consistent with cellulitis of the medial left thigh. Negative for abscess, osteomyelitis or myositis. Electronically Signed   By: Drusilla Kanner M.D.   On: 03/21/2018 13:31    Procedures .Critical Care Performed by: Eber Hong, MD Authorized by: Eber Hong, MD   Critical care provider statement:    Critical care time (minutes):  35   Critical care time was exclusive of:  Separately billable procedures and treating other patients and teaching time   Critical care was necessary to treat or prevent imminent or life-threatening  deterioration of the following conditions:  Sepsis   Critical care was time spent personally by me on the following activities:  Blood draw for specimens, development of treatment plan with patient or surrogate, discussions with consultants, evaluation of patient's response to treatment, examination of patient, obtaining history from patient or surrogate, ordering and performing treatments and interventions, ordering and review of laboratory studies, ordering and review of radiographic studies, pulse oximetry, re-evaluation of patient's condition and review of old charts   EMERGENCY DEPARTMENT US SOFT TISSUE INTERPRETATION "Study: Limited Soft Tissue Ultrasound"  INDICATIONS: Pain and Soft tissue infection Multiple views of the body part were obtained in real-time with a multi-frequency linear probe  PERFORMED BY: Myself IMAGES ARCHIVED?: Yes SIDE:Left BODY PART:Lower extremity INTERPRETATION:  Abcess present and Cellulitis present      (including critical care time)  Medications Ordered in ED Medications  0.9 %  sodium chloride infusion (1,000 mLs Intravenous New Bag/Given 03/22/18 0922)  piperacillin-tazobactam (ZOSYN) IVPB 3.375 g (3.375 g Intravenous New Bag/Given 03/22/18 0923)  vancomycin (VANCOCIN) IVPB 1000 mg/200 mL premix (has no administration in time range)     Initial Impression / Assessment and Plan / ED Course  I have reviewed the triage vital signs and the nursing notes.  Pertinent labs & imaging results that were available during my care of the patient were reviewed by me and considered in my medical decision making (see chart for details).    The patient has a very large infectious area of her left proximal thigh and inguinal area, I do not palpable any areas of fluctuance but will perform a bedside ultrasound to evaluate for possible fluid collection.  In the meantime given her no leukocytosis, her tachycardia and the site of infection code sepsis was activated.   She is not hypotensive and she is not febrile.  Patient's lactic acid is normal however she does have a significant persistent leukocytosis of over 19,000.  She is persistently tachycardic.  She does meet criteria for sepsis.  Blood cultures obtained.  I performed a bedside ultrasound which does not fact show a small abscess.  The patient will be admitted to the hospital under the medical service - appreciate Dr. Kerry Hough for his willingness to admit and care for this patient..  I did discuss care with Dr.  Larae Grooms who will consult from a surgical standpoint.  Final Clinical Impressions(s) / ED Diagnoses   Final diagnoses:  Sepsis, due to unspecified organism (HCC)  Cellulitis and abscess of leg      Eber Hong, MD 03/22/18 [id number]

## 2018-03-22 NOTE — H&P (Signed)
History and Physical    Maria Vargas ZOX:[id number] DOB: 1966/11/06 DOA: 03/22/2018  PCP: Kari Baars, MD  Patient coming from: Home  I have personally briefly reviewed patient's old medical records in [facility]  Chief Complaint: Leg infection  HPI: Maria Vargas is a 52 y.o. female with medical history significant of hypothyroidism, recurrent tendinitis/joint pain on intermittent steroids, presents to the hospital after 3 days of worsening left groin/thigh erythema.  Patient noted onset of symptoms approximately 3 days ago as a boil in her left groin.  The following day, she noticed worsening erythema, pain, swelling and warmth.  She did not notice any discharge.  She has not had any obvious fevers.  She did come to the emergency room yesterday, received a dose of intravenous antibiotics and was discharged home on oral antibiotics.  During her visit, margins of erythema were demarcated.  This morning, patient noted that her erythema had passed margins and her infection appears to be worsening.  She came to the ER for evaluation.  ED Course: She was noted to have an elevated WBC count, but reports completing a course of steroids yesterday.  She was noted to be afebrile and hemodynamically stable.  She had mild tachycardia.  Lactic acid was noted to be normal.  Blood cultures in the emergency room.  She was started on intravenous antibiotics.  Review of Systems: As per HPI otherwise 10 point review of systems negative.    Past Medical History:  Diagnosis Date  . Anxiety   . Asthma   . Depression   . Hypertension   . Thyroid disease     Past Surgical History:  Procedure Laterality Date  . CESAREAN SECTION    . CYSTOSCOPY W/ DILATION OF BLADDER     x2  . ENDOMETRIAL ABLATION    . HYSTEROSCOPY    . TONSILLECTOMY       reports that she has been smoking cigarettes.  She has been smoking about 1.00 pack per day. She has never used smokeless tobacco. She reports that  she drinks alcohol. She reports that she does not use drugs.  Allergies  Allergen Reactions  . Amoxicillin-Pot Clavulanate Other (See Comments)    Bad yeast infection   Family history: Family history reviewed and not pertinent  Prior to Admission medications   Medication Sig Start Date End Date Taking? Authorizing Provider  levothyroxine (SYNTHROID, LEVOTHROID) 50 MCG tablet Take 50 mcg by mouth daily.     Yes [provider]  megestrol (MEGACE) 40 MG tablet Take 20 mg by mouth daily.    Yes [provider]  Multiple Vitamins-Minerals (MULTIVITAMIN ADULT PO) Take 1 tablet by mouth daily.    Yes [provider]  sertraline (ZOLOFT) 100 MG tablet Take 200 mg by mouth daily.    Yes [provider]  spironolactone (ALDACTONE) 25 MG tablet Take 25 mg by mouth daily.     Yes [provider]  Fish Oil-Cholecalciferol (FISH OIL + D3 PO) Take 1 capsule by mouth daily.    [provider]  ibuprofen (ADVIL,MOTRIN) 600 MG tablet Take 1 tablet (600 mg total) by mouth 4 (four) times daily. Patient not taking: Reported on 03/22/2018 12/02/17   Ivery Quale, PA-C    Physical Exam: Vitals:   03/22/18 0915 03/22/18 0930 03/22/18 1000 03/22/18 1133  BP:  (!) 142/78 134/76 (!) 155/75  Pulse: (!) 104 (!) 102 100 99  Resp: (!) 22 (!) Temp:  98.6 F (37 C)  TempSrc:    Oral  SpO2: 96% 97% 97% 97%  Weight:    98 kg (216 lb 0.8 oz)  Height:     (1.473 m)    Constitutional: NAD, calm, comfortable Vitals:   03/22/18 0915 03/22/18 0930 03/22/18 1000 03/22/18 1133  BP:  (!) 142/78 134/76 (!) 155/75  Pulse: (!) 104 (!) 102 100 99  Resp: (!) 22 (!) Temp:    98.6 F (37 C)  TempSrc:    Oral  SpO2: 96% 97% 97% 97%  Weight:    98 kg (216 lb 0.8 oz)  Height:     (1.473 m)   Eyes: PERRL, lids and conjunctivae normal ENMT: Mucous membranes are moist. Posterior pharynx clear of any exudate or lesions.Normal dentition.    Neck: normal, supple, no masses, no thyromegaly Respiratory: clear to auscultation bilaterally, no wheezing, no crackles. Normal respiratory effort. No accessory muscle use.  Cardiovascular: Regular rate and rhythm, no murmurs / rubs / gallops. No extremity edema. 2+ pedal pulses. No carotid bruits.  Abdomen: no tenderness, no masses palpated. No hepatosplenomegaly. Bowel sounds positive.  Musculoskeletal: no clubbing / cyanosis. No joint deformity upper and lower extremities. Good ROM, no contractures. Normal muscle tone.  Skin: Erythema on left medial thigh, starting from groin and extending down towards her knee.  Borders of erythema has been marked Neurologic: CN 2-12 grossly intact. Sensation intact, DTR normal. Strength 5/5 in all 4.  Psychiatric: Normal judgment and insight. Alert and oriented x 3. Normal mood.    Labs on Admission: I have personally reviewed following labs and imaging studies  CBC: Recent Labs  Lab 03/21/18 1153 03/22/18 0847  WBC 22.2* 19.5*  NEUTROABS 17.6* 16.0*  HGB 13.6 13.6  HCT 40.7 40.9  MCV 89.6 90.3  PLT 229 231   Basic Metabolic Panel: Recent Labs  Lab 03/21/18 1153 03/22/18 0847  NA 134* 137  K 3.7 3.5  CL 101 105  CO2 25 25  GLUCOSE 166* 230*  BUN 9 9  CREATININE 0.60 0.57  CALCIUM 8.6* 9.1   GFR: Estimated Creatinine Clearance: 85.5 mL/min (by C-G formula based on SCr of 0.57 mg/dL). Liver Function Tests: Recent Labs  Lab 03/22/18 0847  AST 15  ALT 18  ALKPHOS 41  BILITOT 0.8  PROT 6.7  ALBUMIN 3.0*   No results for input(s): LIPASE, AMYLASE in the last 168 hours. No results for input(s): AMMONIA in the last 168 hours. Coagulation Profile: No results for input(s): INR, PROTIME in the last 168 hours. Cardiac Enzymes: No results for input(s): CKTOTAL, CKMB, CKMBINDEX, TROPONINI in the last 168 hours. BNP (last 3 results) No results for input(s): PROBNP in the last 8760 hours. HbA1C: No results for input(s): HGBA1C in  the last 72 hours. CBG: No results for input(s): GLUCAP in the last 168 hours. Lipid Profile: No results for input(s): CHOL, HDL, LDLCALC, TRIG, CHOLHDL, LDLDIRECT in the last 72 hours. Thyroid Function Tests: No results for input(s): TSH, T4TOTAL, FREET4, T3FREE, THYROIDAB in the last 72 hours. Anemia Panel: No results for input(s): VITAMINB12, FOLATE, FERRITIN, TIBC, IRON, RETICCTPCT in the last 72 hours. Urine analysis:    Component Value Date/Time   COLORURINE YELLOW 03/22/2018 0837   APPEARANCEUR HAZY (A) 03/22/2018 0837   LABSPEC 1.021 03/22/2018 0837   PHURINE 6.0 03/22/2018 0837   GLUCOSEU >=500 (A) 03/22/2018 0837   HGBUR SMALL (A) 03/22/2018 0837   BILIRUBINUR NEGATIVE 03/22/2018 8295  KETONESUR NEGATIVE 03/22/2018 0837   PROTEINUR NEGATIVE 03/22/2018 0837   UROBILINOGEN 1.0 12/28/2009 1109   NITRITE NEGATIVE 03/22/2018 0837   LEUKOCYTESUR NEGATIVE 03/22/2018 0837    Radiological Exams on Admission: Ct Extremity Lower Left W Contrast  Result Date: 03/21/2018 CLINICAL DATA:  Pain and swelling along the medial aspect of the left thigh for 2 days. Question abscess. EXAM: CT OF THE LOWER LEFT EXTREMITY WITH CONTRAST TECHNIQUE: Multidetector CT imaging of the lower left extremity was performed according to the standard protocol following intravenous contrast administration. COMPARISON:  None. CONTRAST:  100 ml ISOVUE-300 IOPAMIDOL (ISOVUE-300) INJECTION 61% FINDINGS: Bones/Joint/Cartilage Normal. No fracture, periosteal reaction, bony destructive change or focal lesion. Ligaments Normal. Muscles and Tendons Normal. Fat planes are preserved. No intramuscular edema or fluid collection. Soft tissues There is stranding in subcutaneous fat of the medial left thigh and cutaneous tissues are thickened. Imaged intrapelvic contents appear normal. IMPRESSION: Findings consistent with cellulitis of the medial left thigh. Negative for abscess, osteomyelitis or myositis. Electronically Signed    By: Drusilla Kanner M.D.   On: 03/21/2018 13:31    EKG: Independently reviewed.  Sinus tachycardia  Assessment/Plan Active Problems:   Cellulitis   Cellulitis and abscess of leg   Hypothyroidism     1. Cellulitis and abscess of left leg.  Small abscess noted on ultrasound imaging per ER physician.  General surgery following.  Attempt was made to aspirate any fluids, but was unsuccessful.  Continue on IV antibiotics for now.  She will be kept n.p.o. after midnight in case operative management is necessary. 2. Hypothyroidism.  Continue on Synthroid 3. Leukocytosis.  Likely partly related to infection, but steroid also likely playing a role.  She completed a course of prednisone yesterday. 4. Hyperglycemia.  No known history of diabetes.  Will check A1c.  Steroids as well as stress from infection are likely playing a role.  Continue to follow blood sugars. 5. History of vaginal bleeding.  She reports being on Megace for the past 10 years.  She is been advised to follow-up with her gynecologist to see if this is still necessary.  DVT prophylaxis: Lovenox  Code Status: Full code Family Communication: No family present Disposition Plan: Discharge home once improved  Consults called: General surgery Admission status: Inpatient, MedSurg  Erick Blinks MD [facility] Pager [phone]  If 7PM-7AM, please contact night-coverage [email or web address] Password [facility]  03/22/2018, 2:49 PM

## 2018-03-22 NOTE — ED Notes (Signed)
US done

## 2018-03-22 NOTE — ED Triage Notes (Signed)
Pt reports was here for abscess to left groin yesterday and was sent home on antibiotics.  Pt says swelling and redness worse today.

## 2018-03-22 NOTE — Consult Note (Signed)
[facility] Consult  Reason for Consult: Left thigh cellulitis, ? Abscess  Referring Physician:  Dr. Susa Raring is a 52 y.o. female.  HPI: Ms. Kimm is a 52 yo with a 72 hr onset of cellulitis of the left inner thigh. She came into the ED yesterday and a CT was done that demonstrated on cellulitis, and she was given the option of staying versus going home. She decided to go home, and noted that this AM, the redness had spread past the markings.  She has no history of diabetes but has been on prednisone for her thumb joints.  She denies prior abscesses or cellulitis. Denies any bug bites. Reports some chills but no fevers recorded.   Past Medical History:  Diagnosis Date  . Anxiety   . Asthma   . Depression   . Hypertension   . Thyroid disease     Past Surgical History:  Procedure Laterality Date  . CESAREAN SECTION    . CYSTOSCOPY W/ DILATION OF BLADDER     x2  . ENDOMETRIAL ABLATION    . HYSTEROSCOPY    . TONSILLECTOMY      History reviewed. No pertinent family history.  Social History   Tobacco Use  . Smoking status: Current Every Day Smoker    Packs/day: 1.00    Types: Cigarettes  . Smokeless tobacco: Never Used  Substance Use Topics  . Alcohol use: Yes    Comment: rarely  . Drug use: Never    Medications:  I have reviewed the patient's current medications. Prior to Admission:  Medications Prior to Admission  Medication Sig Dispense Refill Last Dose  . clindamycin (CLEOCIN) 300 MG capsule Take 1 capsule (300 mg total) by mouth 4 (four) times daily. 40 capsule 0 03/22/2018 at Unknown time  . levothyroxine (SYNTHROID, LEVOTHROID) 50 MCG tablet Take 50 mcg by mouth daily.     03/22/2018 at Unknown time  . megestrol (MEGACE) 40 MG tablet Take 20 mg by mouth daily.    03/22/2018 at Unknown time  . Multiple Vitamins-Minerals (MULTIVITAMIN ADULT PO) Take 1 tablet by mouth daily.    03/22/2018 at Unknown time  . sertraline (ZOLOFT) 100  MG tablet Take 200 mg by mouth daily.    03/22/2018 at Unknown time  . spironolactone (ALDACTONE) 25 MG tablet Take 25 mg by mouth daily.     03/22/2018 at Unknown time  . dexamethasone (DECADRON) 4 MG tablet Take 1 tablet (4 mg total) by mouth 2 (two) times daily with a meal. (Patient not taking: Reported on 03/22/2018) 10 tablet 0 Completed Course at Unknown time  . Fish Oil-Cholecalciferol (FISH OIL + D3 PO) Take 1 capsule by mouth daily.   Past Week at Unknown time  . ibuprofen (ADVIL,MOTRIN) 600 MG tablet Take 1 tablet (600 mg total) by mouth 4 (four) times daily. (Patient not taking: Reported on 03/22/2018) 30 tablet 0 Completed Course at Unknown time  . predniSONE (DELTASONE) 10 MG tablet Take 10-40 mg by mouth daily. For 3 days take 4 tablets by mouth, for 3 days take 3 tablets by mouth, for 3 days take 2 tablets by mouth, and for 3 days take 1 tablet by mouth  0 03/20/2018 at Unknown time   Scheduled:  Continuous: . sodium chloride 1,000 mL (03/22/18 0922)   PRN:  Allergies  Allergen Reactions  . Amoxicillin-Pot Clavulanate Other (See Comments)    Bad yeast infection     ROS:  A  comprehensive review of systems was negative except for: Constitutional: positive for chills Musculoskeletal: positive for swelling and redness left medial thigh  Blood pressure (!) 155/75, pulse 99, temperature 98.6 F (37 C), temperature source Oral, resp. rate 18, height '4\' 10"'$  (1.473 m), weight 216 lb 0.8 oz (98 kg), SpO2 97 %. Physical Exam  Constitutional: She is oriented to person, place, and time. She appears well-developed and well-nourished.  HENT:  Head: Normocephalic.  Eyes: Pupils are equal, round, and reactive to light.  Neck: Normal range of motion.  Cardiovascular: Normal rate and regular rhythm.  Pulmonary/Chest: Effort normal.  Abdominal: Soft. She exhibits no distension. There is no tenderness.  Musculoskeletal: Normal range of motion. She exhibits edema and tenderness.  Small  draining ulcerated area overlying the possible fluid pocket on [place], no crepitus  Neurological: She is alert and oriented to person, place, and time.  Skin: Skin is warm and dry.  Psychiatric: She has a normal mood and affect. Judgment and thought content normal.  Vitals reviewed.     Bedside Ultrasound of erythema- ulcerated area that appears to have drained ? possible small 1-1.5cm collection?     Results: Results for orders placed or performed during the hospital encounter of 03/22/18 (from the past 48 hour(s))  Urinalysis, Routine w reflex microscopic     Status: Abnormal   Collection Time: 03/22/18  8:37 AM  Result Value Ref Range   Color, Urine YELLOW YELLOW   APPearance HAZY (A) CLEAR   Specific Gravity, Urine 1.021 1.005 - 1.030   pH 6.0 5.0 - 8.0   Glucose, UA >=500 (A) NEGATIVE mg/dL   Hgb urine dipstick SMALL (A) NEGATIVE   Bilirubin Urine NEGATIVE NEGATIVE   Ketones, ur NEGATIVE NEGATIVE mg/dL   Protein, ur NEGATIVE NEGATIVE mg/dL   Nitrite NEGATIVE NEGATIVE   Leukocytes, UA NEGATIVE NEGATIVE   RBC / HPF 0-5 0 - 5 RBC/hpf   WBC, UA 0-5 0 - 5 WBC/hpf   Bacteria, UA NONE SEEN NONE SEEN   Squamous Epithelial / LPF 0-5 0 - 5   Mucus PRESENT     Comment: Performed at [facility], [address], [place], [place] [address]  Comprehensive metabolic panel     Status: Abnormal   Collection Time: 03/22/18  8:47 AM  Result Value Ref Range   Sodium 137 135 - 145 mmol/L   Potassium 3.5 3.5 - 5.1 mmol/L   Chloride 105 101 - 111 mmol/L   CO2 25 22 - 32 mmol/L   Glucose, Bld 230 (H) 65 - 99 mg/dL   BUN 9 6 - 20 mg/dL   Creatinine, Ser 0.57 0.44 - 1.00 mg/dL   Calcium 9.1 8.9 - 10.3 mg/dL   Total Protein 6.7 6.5 - 8.1 g/dL   Albumin 3.0 (L) 3.5 - 5.0 g/dL   AST 15 15 - 41 U/L   ALT 18 14 - 54 U/L   Alkaline Phosphatase 41 38 - 126 U/L   Total Bilirubin 0.8 0.3 - 1.2 mg/dL   GFR calc non Af Amer >60 >60 mL/min   GFR calc Af Amer >60 >60 mL/min    Comment: (NOTE) The  eGFR has been calculated using the CKD EPI equation. This calculation has not been validated in all clinical situations. eGFR's persistently <60 mL/min signify possible Chronic Kidney Disease.    Anion gap 7 5 - 15    Comment: Performed at [facility], [address], [place], [place] [address]  CBC WITH DIFFERENTIAL  Status: Abnormal   Collection Time: 03/22/18  8:47 AM  Result Value Ref Range   WBC 19.5 (H) 4.0 - 10.5 K/uL   RBC 4.53 3.87 - 5.11 MIL/uL   Hemoglobin 13.6 12.0 - 15.0 g/dL   HCT 40.9 36.0 - 46.0 %   MCV 90.3 78.0 - 100.0 fL   MCH 30.0 26.0 - 34.0 pg   MCHC 33.3 30.0 - 36.0 g/dL   RDW 13.6 11.5 - 15.5 %   Platelets 231 150 - 400 K/uL   Neutrophils Relative % 83 %   Neutro Abs 16.0 (H) 1.7 - 7.7 K/uL   Lymphocytes Relative 11 %   Lymphs Abs 2.2 0.7 - 4.0 K/uL   Monocytes Relative 5 %   Monocytes Absolute 1.0 0.1 - 1.0 K/uL   Eosinophils Relative 1 %   Eosinophils Absolute 0.2 0.0 - 0.7 K/uL   Basophils Relative 0 %   Basophils Absolute 0.0 0.0 - 0.1 K/uL    Comment: Performed at [facility], [address], [place], [place] [address]  Blood Culture (routine x 2)     Status: None (Preliminary result)   Collection Time: 03/22/18  8:53 AM  Result Value Ref Range   Specimen Description      LEFT ANTECUBITAL BOTTLES DRAWN AEROBIC AND ANAEROBIC   Special Requests      Blood Culture results may not be optimal due to an inadequate volume of blood received in culture bottles Performed at [facility], [address] [place], [place] [address]    Culture PENDING    Report Status PENDING   Blood Culture (routine x 2)     Status: None (Preliminary result)   Collection Time: 03/22/18  8:57 AM  Result Value Ref Range   Specimen Description BLOOD LEFT ARM BOTTLES DRAWN AEROBIC AND ANAEROBIC    Special Requests      Blood Culture adequate volume Performed at [facility], [address], [place], [place] [address]    Culture PENDING    Report Status PENDING   I-Stat  CG4 Lactic Acid, ED  (not at  [facility])     Status: None   Collection Time: 03/22/18  9:07 AM  Result Value Ref Range   Lactic Acid, Venous 1.49 0.5 - 1.9 mmol/L   Personally reviewed- inflammation./ stranding of the subcutaneous tissue, no collections noted   Ct Extremity Lower Left W Contrast  Result Date: 03/21/2018 CLINICAL DATA:  Pain and swelling along the medial aspect of the left thigh for 2 days. Question abscess. EXAM: CT OF THE LOWER LEFT EXTREMITY WITH CONTRAST TECHNIQUE: Multidetector CT imaging of the lower left extremity was performed according to the standard protocol following intravenous contrast administration. COMPARISON:  None. CONTRAST:  100 ml ISOVUE-300 IOPAMIDOL (ISOVUE-300) INJECTION 61% FINDINGS: Bones/Joint/Cartilage Normal. No fracture, periosteal reaction, bony destructive change or focal lesion. Ligaments Normal. Muscles and Tendons Normal. Fat planes are preserved. No intramuscular edema or fluid collection. Soft tissues There is stranding in subcutaneous fat of the medial left thigh and cutaneous tissues are thickened. Imaged intrapelvic contents appear normal. IMPRESSION: Findings consistent with cellulitis of the medial left thigh. Negative for abscess, osteomyelitis or myositis. Electronically Signed   By: Inge Rise M.D.   On: 03/21/2018 13:31   Procedure: US guided aspiration of fluid pocket/ ? Abscess Indication: Possible abscess, cellulitis  Description: patient gave verbal consent. Using an ultrasound. The leg was prepped with chloraprep and the area of the possible 1cm collection was numbed with lidocaine and  aspiration was attempted with no purulent contents evacuated. Likely edema.  Clean gauze and papertape applied to the injection site. Patient tolerated well.    Assessment & Plan:  MIRAH NEVINS is a 52 y.o. female with left thigh cellulitis, small 1cm possible collection on [place] attempted to aspirate and nothing obtained. Do not see any other fluid  collections, likely just edema. (Done at the bedside and verbal consent from the patient, see above).   -IV antibiotics  -Monitor marking, the second dotted line is from this AM, if expanding or getting bullae notify as this could be a necrotizing infection -As of now not acting like a necrotizing infection as it has progressed over 72+ hrs, Na normal, lactic normal, and no bullae, no crepitus on exam  -Diet for now, NPO at midnight pending any need for OR/ drainage   All questions were answered to the satisfaction of the patient.     Virl Cagey 03/22/2018, 11:57 AM

## 2018-03-22 NOTE — ED Notes (Signed)
Dr Henreitta Leber completing bedside procedure/

## 2018-03-22 NOTE — Progress Notes (Addendum)
Pharmacy Note:  Initial antibiotic(s) regimen of Vancomycin and Zosyn  ordered by EDP to treat Sepsis/Cellulitis.  Estimated Creatinine Clearance: 85.5 mL/min (by C-G formula based on SCr of 0.6 mg/dL).   Allergies  Allergen Reactions  . Amoxicillin-Pot Clavulanate Other (See Comments)    Bad yeast infection    Vitals:   03/22/18 0828  BP: (!) 164/73  Pulse: (!) 108  Resp: 18  Temp: 98.8 F (37.1 C)  SpO2: 98%    Anti-infectives (From admission, onward)   Start     Dose/Rate Route Frequency Ordered Stop   03/22/18 0845  piperacillin-tazobactam (ZOSYN) IVPB 3.375 g     3.375 g 100 mL/hr over 30 Minutes Intravenous  Once 03/22/18 0837     03/22/18 0845  vancomycin (VANCOCIN) IVPB 1000 mg/200 mL premix     1,000 mg 200 mL/hr over 60 Minutes Intravenous  Once 03/22/18 0837        Plan: Initial dose(s) of Vancomycin and Zosyn  X 1 ordered. F/U admission orders for further dosing if therapy continued.  Mady Gemma, [facility] 03/22/2018 8:49 AM   Patient admitted. H&P and admission orders pending. Vancomycin 1gm IV every 8 hours. Zosyn 3.375gm IV every 8 hours. Follow-up micro data, labs, vitals.  Mady Gemma, [facility] 03/22/2018 2:43 PM

## 2018-03-23 ENCOUNTER — Inpatient Hospital Stay (HOSPITAL_COMMUNITY): Payer: Medicaid Other

## 2018-03-23 DIAGNOSIS — L03116 Cellulitis of left lower limb: Secondary | ICD-10-CM

## 2018-03-23 LAB — GLUCOSE, CAPILLARY
GLUCOSE-CAPILLARY: 140 mg/dL — AB (ref 65–99)
GLUCOSE-CAPILLARY: 138 mg/dL — AB (ref 65–99)
GLUCOSE-CAPILLARY: 129 mg/dL — AB (ref 65–99)
Glucose-Capillary: 140 mg/dL — ABNORMAL HIGH (ref 65–99)

## 2018-03-23 LAB — CBC
HEMATOCRIT: 39.5 % (ref 36.0–46.0)
HEMOGLOBIN: 13 g/dL (ref 12.0–15.0)
MCH: 30.1 pg (ref 26.0–34.0)
MCHC: 32.9 g/dL (ref 30.0–36.0)
MCV: 91.4 fL (ref 78.0–100.0)
Platelets: 241 10*3/uL (ref 150–400)
RBC: 4.32 MIL/uL (ref 3.87–5.11)
RDW: 13.7 % (ref 11.5–15.5)
WBC: 14.6 10*3/uL — ABNORMAL HIGH (ref 4.0–10.5)

## 2018-03-23 LAB — COMPREHENSIVE METABOLIC PANEL
ALK PHOS: 35 U/L — AB (ref 38–126)
ALT: 14 U/L (ref 14–54)
AST: 12 U/L — ABNORMAL LOW (ref 15–41)
Albumin: 2.7 g/dL — ABNORMAL LOW (ref 3.5–5.0)
Anion gap: 6 (ref 5–15)
BILIRUBIN TOTAL: 0.6 mg/dL (ref 0.3–1.2)
BUN: 10 mg/dL (ref 6–20)
CALCIUM: 8.3 mg/dL — AB (ref 8.9–10.3)
CO2: 26 mmol/L (ref 22–32)
CREATININE: 0.54 mg/dL (ref 0.44–1.00)
Chloride: 106 mmol/L (ref 101–111)
GFR calc non Af Amer: >60 mL/min (ref >60)
Glucose, Bld: 149 mg/dL — ABNORMAL HIGH (ref 65–99)
Potassium: 3.5 mmol/L (ref 3.5–5.1)
Sodium: 138 mmol/L (ref 135–145)
TOTAL PROTEIN: 6.2 g/dL — AB (ref 6.5–8.1)

## 2018-03-23 LAB — HIV ANTIBODY (ROUTINE TESTING W REFLEX): HIV Screen 4th Generation wRfx: NONREACTIVE

## 2018-03-23 MED ORDER — IBUPROFEN 400 MG PO TABS
400.0000 mg | ORAL_TABLET | Freq: Four times a day (QID) | ORAL | Status: DC | PRN
  Administered 2018-03-23 – 2018-03-25 (×2): 400 mg via ORAL
  Filled 2018-03-23 (×2): qty 1

## 2018-03-23 MED ORDER — NICOTINE 21 MG/24HR TD PT24
21.0000 mg | MEDICATED_PATCH | Freq: Every day | TRANSDERMAL | Status: DC
  Administered 2018-03-23 – 2018-03-26 (×4): 21 mg via TRANSDERMAL
  Filled 2018-03-23 (×4): qty 1

## 2018-03-23 MED ORDER — IOPAMIDOL (ISOVUE-300) INJECTION 61%
100.0000 mL | Freq: Once | INTRAVENOUS | Status: AC | PRN
  Administered 2018-03-23: 100 mL via INTRAVENOUS

## 2018-03-23 NOTE — Progress Notes (Signed)
Pt asking about getting her Hemoglobin A1C checked this admission, since her blood sugars have been elevated.

## 2018-03-23 NOTE — Progress Notes (Signed)
Subjective: She was admitted with cellulitis of her left leg in the thigh area.  There was an area that seemed more like an abscess but drainage attempts were unsuccessful with no material obtained.  This morning there has been some extension inferiorly based on the markings but she says it feels a little bit better.  Objective: Vital signs in last 24 hours: Temp:  [98.4 F (36.9 C)-98.6 F (37 C)] 98.4 F (36.9 C) (05/12 0553) Pulse Rate:  [86-104] 86 (05/12 0553) Resp:  [18-26] 20 (05/11 2059) BP: (131-155)/(56-78) 136/71 (05/12 0553) SpO2:  [96 %-100 %] 99 % (05/12 0553) Weight:  [98 kg (216 lb 0.8 oz)] 98 kg (216 lb 0.8 oz) (05/11 1133) Weight change:  Last BM Date: 03/22/18  Intake/Output from previous day: 05/11 0701 - 05/12 0700 In: 2451.7 [P.O.:600; I.V.:1101.7; IV Piggyback:750] Out: -   PHYSICAL EXAM General appearance: alert, cooperative and no distress Resp: clear to auscultation bilaterally Cardio: regular rate and rhythm, S1, S2 normal, no murmur, click, rub or gallop GI: soft, non-tender; bowel sounds normal; no masses,  no organomegaly Extremities: She has a large area of erythema and warmth on her left upper thigh medially per markings it has increased some inferiorly Throat is clear  Lab Results:  Results for orders placed or performed during the hospital encounter of 03/22/18 (from the past 48 hour(s))  Urinalysis, Routine w reflex microscopic     Status: Abnormal   Collection Time: 03/22/18  8:37 AM  Result Value Ref Range   Color, Urine YELLOW YELLOW   APPearance HAZY (A) CLEAR   Specific Gravity, Urine 1.021 1.005 - 1.030   pH 6.0 5.0 - 8.0   Glucose, UA >=500 (A) NEGATIVE mg/dL   Hgb urine dipstick SMALL (A) NEGATIVE   Bilirubin Urine NEGATIVE NEGATIVE   Ketones, ur NEGATIVE NEGATIVE mg/dL   Protein, ur NEGATIVE NEGATIVE mg/dL   Nitrite NEGATIVE NEGATIVE   Leukocytes, UA NEGATIVE NEGATIVE   RBC / HPF 0-5 0 - 5 RBC/hpf   WBC, UA 0-5 0 - 5 WBC/hpf    Bacteria, UA NONE SEEN NONE SEEN   Squamous Epithelial / LPF 0-5 0 - 5   Mucus PRESENT     Comment: Performed at [facility], [address], [place], [place] [address]  Comprehensive metabolic panel     Status: Abnormal   Collection Time: 03/22/18  8:47 AM  Result Value Ref Range   Sodium 137 135 - 145 mmol/L   Potassium 3.5 3.5 - 5.1 mmol/L   Chloride 105 101 - 111 mmol/L   CO2 25 22 - 32 mmol/L   Glucose, Bld 230 (H) 65 - 99 mg/dL   BUN 9 6 - 20 mg/dL   Creatinine, Ser 0.57 0.44 - 1.00 mg/dL   Calcium 9.1 8.9 - 10.3 mg/dL   Total Protein 6.7 6.5 - 8.1 g/dL   Albumin 3.0 (L) 3.5 - 5.0 g/dL   AST 15 15 - 41 U/L   ALT 18 14 - 54 U/L   Alkaline Phosphatase 41 38 - 126 U/L   Total Bilirubin 0.8 0.3 - 1.2 mg/dL   GFR calc non Af Amer >60 >60 mL/min   GFR calc Af Amer >60 >60 mL/min    Comment: (NOTE) The eGFR has been calculated using the CKD EPI equation. This calculation has not been validated in all clinical situations. eGFR's persistently <60 mL/min signify possible Chronic Kidney Disease.    Anion gap 7 5 - 15  Comment: Performed at [facility], [address], [place], [place] [address]  CBC WITH DIFFERENTIAL     Status: Abnormal   Collection Time: 03/22/18  8:47 AM  Result Value Ref Range   WBC 19.5 (H) 4.0 - 10.5 K/uL   RBC 4.53 3.87 - 5.11 MIL/uL   Hemoglobin 13.6 12.0 - 15.0 g/dL   HCT 40.9 36.0 - 46.0 %   MCV 90.3 78.0 - 100.0 fL   MCH 30.0 26.0 - 34.0 pg   MCHC 33.3 30.0 - 36.0 g/dL   RDW 13.6 11.5 - 15.5 %   Platelets 231 150 - 400 K/uL   Neutrophils Relative % 83 %   Neutro Abs 16.0 (H) 1.7 - 7.7 K/uL   Lymphocytes Relative 11 %   Lymphs Abs 2.2 0.7 - 4.0 K/uL   Monocytes Relative 5 %   Monocytes Absolute 1.0 0.1 - 1.0 K/uL   Eosinophils Relative 1 %   Eosinophils Absolute 0.2 0.0 - 0.7 K/uL   Basophils Relative 0 %   Basophils Absolute 0.0 0.0 - 0.1 K/uL    Comment: Performed at [facility], [address] [place], [place] [address]  Hemoglobin  A1c     Status: Abnormal   Collection Time: 03/22/18  8:47 AM  Result Value Ref Range   Hgb A1c MFr Bld 7.6 (H) 4.8 - 5.6 %    Comment: (NOTE) Pre diabetes:          5.7%-6.4% Diabetes:              >6.4% Glycemic control for   <7.0% adults with diabetes    Mean Plasma Glucose 171.42 mg/dL    Comment: Performed at [facility] [address] [place], [place] [address]  Blood Culture (routine x 2)     Status: None (Preliminary result)   Collection Time: 03/22/18  8:53 AM  Result Value Ref Range   Specimen Description      LEFT ANTECUBITAL BOTTLES DRAWN AEROBIC AND ANAEROBIC   Special Requests      Blood Culture results may not be optimal due to an inadequate volume of blood received in culture bottles   Culture      NO GROWTH < 24 HOURS Performed at [facility], [address] [place], [place] [address]    Report Status PENDING   Blood Culture (routine x 2)     Status: None (Preliminary result)   Collection Time: 03/22/18  8:57 AM  Result Value Ref Range   Specimen Description BLOOD LEFT ARM BOTTLES DRAWN AEROBIC AND ANAEROBIC    Special Requests Blood Culture adequate volume    Culture      NO GROWTH < 24 HOURS Performed at [facility], [address] [place], [place] [address]    Report Status PENDING   I-Stat CG4 Lactic Acid, ED  (not at  [facility])     Status: None   Collection Time: 03/22/18  9:07 AM  Result Value Ref Range   Lactic Acid, Venous 1.49 0.5 - 1.9 mmol/L  Glucose, capillary     Status: Abnormal   Collection Time: 03/22/18  4:41 PM  Result Value Ref Range   Glucose-Capillary 152 (H) 65 - 99 mg/dL  Glucose, capillary     Status: Abnormal   Collection Time: 03/22/18  8:57 PM  Result Value Ref Range   Glucose-Capillary 173 (H) 65 - 99 mg/dL   Comment 1 Notify RN    Comment 2 Document in Chart  CBC     Status: Abnormal   Collection Time: 03/23/18  6:01 AM  Result Value Ref Range   WBC 14.6 (H) 4.0 - 10.5 K/uL   RBC 4.32 3.87 - 5.11 MIL/uL    Hemoglobin 13.0 12.0 - 15.0 g/dL   HCT 39.5 36.0 - 46.0 %   MCV 91.4 78.0 - 100.0 fL   MCH 30.1 26.0 - 34.0 pg   MCHC 32.9 30.0 - 36.0 g/dL   RDW 13.7 11.5 - 15.5 %   Platelets 241 150 - 400 K/uL    Comment: Performed at [facility], [address], [place], [place] [address]  Comprehensive metabolic panel     Status: Abnormal   Collection Time: 03/23/18  6:01 AM  Result Value Ref Range   Sodium 138 135 - 145 mmol/L   Potassium 3.5 3.5 - 5.1 mmol/L   Chloride 106 101 - 111 mmol/L   CO2 26 22 - 32 mmol/L   Glucose, Bld 149 (H) 65 - 99 mg/dL   BUN 10 6 - 20 mg/dL   Creatinine, Ser 0.54 0.44 - 1.00 mg/dL   Calcium 8.3 (L) 8.9 - 10.3 mg/dL   Total Protein 6.2 (L) 6.5 - 8.1 g/dL   Albumin 2.7 (L) 3.5 - 5.0 g/dL   AST 12 (L) 15 - 41 U/L   ALT 14 14 - 54 U/L   Alkaline Phosphatase 35 (L) 38 - 126 U/L   Total Bilirubin 0.6 0.3 - 1.2 mg/dL   GFR calc non Af Amer >60 >60 mL/min   GFR calc Af Amer >60 >60 mL/min    Comment: (NOTE) The eGFR has been calculated using the CKD EPI equation. This calculation has not been validated in all clinical situations. eGFR's persistently <60 mL/min signify possible Chronic Kidney Disease.    Anion gap 6 5 - 15    Comment: Performed at [facility], [address], [place], [place] [address]  Glucose, capillary     Status: Abnormal   Collection Time: 03/23/18  8:01 AM  Result Value Ref Range   Glucose-Capillary 140 (H) 65 - 99 mg/dL    ABGS No results for input(s): PHART, PO2ART, TCO2, HCO3 in the last 72 hours.  Invalid input(s): PCO2 CULTURES Recent Results (from the past 240 hour(s))  Blood Culture (routine x 2)     Status: None (Preliminary result)   Collection Time: 03/22/18  8:53 AM  Result Value Ref Range Status   Specimen Description   Final    LEFT ANTECUBITAL BOTTLES DRAWN AEROBIC AND ANAEROBIC   Special Requests   Final    Blood Culture results may not be optimal due to an inadequate volume of blood received in culture bottles    Culture   Final    NO GROWTH < 24 HOURS Performed at [facility], [address] [place] [address], [place], [place] [address]    Report Status PENDING  Incomplete  Blood Culture (routine x 2)     Status: None (Preliminary result)   Collection Time: 03/22/18  8:57 AM  Result Value Ref Range Status   Specimen Description BLOOD LEFT ARM BOTTLES DRAWN AEROBIC AND ANAEROBIC  Final   Special Requests Blood Culture adequate volume  Final   Culture   Final    NO GROWTH < 24 HOURS Performed at [facility], [address] [place], [place] [address]    Report Status PENDING  Incomplete   Studies/Results: Ct Extremity Lower Left W Contrast  Result Date: 03/21/2018 CLINICAL DATA:  Pain and swelling along the medial aspect of the left thigh for 2 days. Question abscess. EXAM: CT OF THE LOWER LEFT EXTREMITY WITH CONTRAST TECHNIQUE: Multidetector CT imaging of the lower left extremity was performed according to the standard protocol following intravenous contrast administration. COMPARISON:  None. CONTRAST:  100 ml ISOVUE-300 IOPAMIDOL (ISOVUE-300) INJECTION 61% FINDINGS: Bones/Joint/Cartilage Normal. No fracture, periosteal reaction, bony destructive change or focal lesion. Ligaments Normal. Muscles and Tendons Normal. Fat planes are preserved. No intramuscular edema or fluid collection. Soft tissues There is stranding in subcutaneous fat of the medial left thigh and cutaneous tissues are thickened. Imaged intrapelvic contents appear normal. IMPRESSION: Findings consistent with cellulitis of the medial left thigh. Negative for abscess, osteomyelitis or myositis. Electronically Signed   By: Thomas  Dalessio M.D.   On: 03/21/2018 13:31    Medications:  Prior to Admission:  Medications Prior to Admission  Medication Sig Dispense Refill Last Dose  . levothyroxine (SYNTHROID, LEVOTHROID) 50 MCG tablet Take 50 mcg by mouth daily.     03/22/2018 at Unknown time  . megestrol (MEGACE) 40 MG tablet Take 20 mg by mouth daily.     03/22/2018 at Unknown time  . Multiple Vitamins-Minerals (MULTIVITAMIN ADULT PO) Take 1 tablet by mouth daily.    03/22/2018 at Unknown time  . sertraline (ZOLOFT) 100 MG tablet Take 200 mg by mouth daily.    03/22/2018 at Unknown time  . spironolactone (ALDACTONE) 25 MG tablet Take 25 mg by mouth daily.     03/22/2018 at Unknown time  . Fish Oil-Cholecalciferol (FISH OIL + D3 PO) Take 1 capsule by mouth daily.   Past Week at Unknown time  . ibuprofen (ADVIL,MOTRIN) 600 MG tablet Take 1 tablet (600 mg total) by mouth 4 (four) times daily. (Patient not taking: Reported on 03/22/2018) 30 tablet 0 Completed Course at Unknown time   Scheduled: . enoxaparin (LOVENOX) injection  40 mg Subcutaneous Daily  . insulin aspart  0-9 Units Subcutaneous TID WC  . ketorolac  30 mg Intravenous Q6H  . levothyroxine  50 mcg Oral QAC breakfast  . megestrol  20 mg Oral Daily  . sertraline  200 mg Oral Daily  . spironolactone  25 mg Oral Daily   Continuous: . sodium chloride 100 mL/hr at 03/22/18 2324  . piperacillin-tazobactam (ZOSYN)  IV 3.375 g (03/23/18 0812)  . vancomycin 1,000 mg (03/23/18 0815)   PRN:acetaminophen **OR** acetaminophen, HYDROcodone-acetaminophen  Assesment: She has cellulitis of her left leg.  There may be an abscess but it was not able to be aspirated.  She has been having a lot of trouble with her joints and that seems about the same.  She was negative for rheumatoid arthritis and she has been referred to a rheumatologist.  She has depression at baseline.  She has hypothyroidism at baseline Active Problems:   Cellulitis   Cellulitis and abscess of leg   Hypothyroidism    Plan: Continue treatments    LOS: 1 day   , L 03/23/2018, 8:39 AM  

## 2018-03-23 NOTE — Progress Notes (Signed)
[facility] Progress Note     Subjective: Pain improved. Moving around better. Cellulitis improved in some areas and extended in others. Color less dark.   Objective: Vital signs in last 24 hours: Temp:  [98.4 F (36.9 C)-98.6 F (37 C)] 98.4 F (36.9 C) (05/12 0553) Pulse Rate:  [86-100] 86 (05/12 0553) Resp:  [18-20] 20 (05/11 2059) BP: (131-155)/(56-76) 136/71 (05/12 0553) SpO2:  [97 %-100 %] 99 % (05/12 0553) Weight:  [216 lb 0.8 oz (98 kg)] 216 lb 0.8 oz (98 kg) (05/11 1133) Last BM Date: 03/22/18  Intake/Output from previous day: 05/11 0701 - 05/12 0700 In: 2451.7 [P.O.:600; I.V.:1101.7; IV Piggyback:750] Out: -  Intake/Output this shift: Total I/O In: 200 [IV Piggyback:200] Out: -   General appearance: alert, cooperative and no distress Resp: normal work of breathing Extremities: left thigh, marked, lower portion extended erythema but lighter, upper portion contracted, less swollen medially  Lab Results:  Recent Labs    03/22/18 0847 03/23/18 0601  WBC 19.5* 14.6*  HGB 13.6 13.0  HCT 40.9 39.5  PLT 231 241   BMET Recent Labs    03/22/18 0847 03/23/18 0601  NA 137 138  K 3.5 3.5  CL 105 106  CO2 25 26  GLUCOSE 230* 149*  BUN 9 10  CREATININE 0.57 0.54  CALCIUM 9.1 8.3*    Studies/Results: Ct Extremity Lower Left W Contrast  Result Date: 03/21/2018 CLINICAL DATA:  Pain and swelling along the medial aspect of the left thigh for 2 days. Question abscess. EXAM: CT OF THE LOWER LEFT EXTREMITY WITH CONTRAST TECHNIQUE: Multidetector CT imaging of the lower left extremity was performed according to the standard protocol following intravenous contrast administration. COMPARISON:  None. CONTRAST:  100 ml ISOVUE-300 IOPAMIDOL (ISOVUE-300) INJECTION 61% FINDINGS: Bones/Joint/Cartilage Normal. No fracture, periosteal reaction, bony destructive change or focal lesion. Ligaments Normal. Muscles and Tendons Normal. Fat planes are preserved. No  intramuscular edema or fluid collection. Soft tissues There is stranding in subcutaneous fat of the medial left thigh and cutaneous tissues are thickened. Imaged intrapelvic contents appear normal. IMPRESSION: Findings consistent with cellulitis of the medial left thigh. Negative for abscess, osteomyelitis or myositis. Electronically Signed   By: Drusilla Kanner M.D.   On: 03/21/2018 13:31    Anti-infectives: Anti-infectives (From admission, onward)   Start     Dose/Rate Route Frequency Ordered Stop   03/22/18 1700  vancomycin (VANCOCIN) IVPB 1000 mg/200 mL premix     1,000 mg 200 mL/hr over 60 Minutes Intravenous Every 8 hours 03/22/18 1442     03/22/18 1600  piperacillin-tazobactam (ZOSYN) IVPB 3.375 g     3.375 g 12.5 mL/hr over 240 Minutes Intravenous Every 8 hours 03/22/18 1442     03/22/18 0845  piperacillin-tazobactam (ZOSYN) IVPB 3.375 g     3.375 g 100 mL/hr over 30 Minutes Intravenous  Once 03/22/18 0837 03/22/18 1000   03/22/18 0845  vancomycin (VANCOCIN) IVPB 1000 mg/200 mL premix     1,000 mg 200 mL/hr over 60 Minutes Intravenous  Once 03/22/18 8295 03/22/18 1102      Assessment/Plan: Maria Vargas is a 52 yo with a left thigh cellulitis, no evidence yesterday of drainable collection with some improvement and some extension of the cellulitis. Does not look like necrotizing infection, WBC down, NA wnl, and pain improved. -CT left lower extremity to assess for any fluid collections not appreciated on [place] at bedside yesterday  -NPO until after CT extremity  -Updated Dr. Juanetta Gosling  LOS: 1 day    Maria Vargas 03/23/2018

## 2018-03-23 NOTE — Progress Notes (Signed)
Received verbal order from Dr. Baltazar Apo for the pt to get a 21 mg nicotine patch daily.

## 2018-03-23 NOTE — Progress Notes (Signed)
[facility]  CT reviewed. Stranding and edema in the subcutaneous tissue. No abscesses, no gas. Recommend continue IV antibiotics. No surgical debridement/ drainage needed.  Restarted diet.  Algis Greenhouse, MD [facility] [address] [place], [place] [address] [phone] (office)

## 2018-03-24 LAB — GLUCOSE, CAPILLARY
GLUCOSE-CAPILLARY: 136 mg/dL — AB (ref 65–99)
GLUCOSE-CAPILLARY: 149 mg/dL — AB (ref 65–99)
Glucose-Capillary: 124 mg/dL — ABNORMAL HIGH (ref 65–99)
Glucose-Capillary: 152 mg/dL — ABNORMAL HIGH (ref 65–99)

## 2018-03-24 LAB — BASIC METABOLIC PANEL
ANION GAP: 5 (ref 5–15)
BUN: 9 mg/dL (ref 6–20)
CALCIUM: 8.3 mg/dL — AB (ref 8.9–10.3)
CO2: 24 mmol/L (ref 22–32)
Chloride: 110 mmol/L (ref 101–111)
Creatinine, Ser: 0.86 mg/dL (ref 0.44–1.00)
GFR calc Af Amer: >60 mL/min (ref >60)
GLUCOSE: 141 mg/dL — AB (ref 65–99)
POTASSIUM: 3.8 mmol/L (ref 3.5–5.1)
Sodium: 139 mmol/L (ref 135–145)

## 2018-03-24 LAB — MRSA PCR SCREENING: MRSA by PCR: NEGATIVE

## 2018-03-24 MED ORDER — LIVING WELL WITH DIABETES BOOK
Freq: Once | Status: DC
  Filled 2018-03-24: qty 1

## 2018-03-24 NOTE — Progress Notes (Signed)
Subjective: She was admitted with cellulitis of her left thigh.  She says she feels better.  She was complaining of headache yesterday and that has resolved.  She had another CT of her leg yesterday that did not show an abscess.  However the erythema has spread some more overnight.  She has no other new complaints  Objective: Vital signs in last 24 hours: Temp:  [97.7 F (36.5 C)-98.9 F (37.2 C)] 98.1 F (36.7 C) (05/13 0459) Pulse Rate:  [86-90] 90 (05/13 0459) Resp:  [18-20] 18 (05/13 0459) BP: (132-140)/(68-77) 132/77 (05/13 0459) SpO2:  [98 %-100 %] 98 % (05/13 0459) Weight change:  Last BM Date: 03/22/18  Intake/Output from previous day: 05/12 0701 - 05/13 0700 In: 3690 [P.O.:840; I.V.:2100; IV Piggyback:750] Out: -   PHYSICAL EXAM General appearance: alert, cooperative and no distress Resp: clear to auscultation bilaterally Cardio: regular rate and rhythm, S1, S2 normal, no murmur, click, rub or gallop GI: soft, non-tender; bowel sounds normal; no masses,  no organomegaly Extremities: She still has erythema of her thigh.  It seems less warm.  She has a hard band just at the point where her thigh joints her abdomen.  Lab Results:  Results for orders placed or performed during the hospital encounter of 03/22/18 (from the past 48 hour(s))  Urinalysis, Routine w reflex microscopic     Status: Abnormal   Collection Time: 03/22/18  8:37 AM  Result Value Ref Range   Color, Urine YELLOW YELLOW   APPearance HAZY (A) CLEAR   Specific Gravity, Urine 1.021 1.005 - 1.030   pH 6.0 5.0 - 8.0   Glucose, UA >=500 (A) NEGATIVE mg/dL   Hgb urine dipstick SMALL (A) NEGATIVE   Bilirubin Urine NEGATIVE NEGATIVE   Ketones, ur NEGATIVE NEGATIVE mg/dL   Protein, ur NEGATIVE NEGATIVE mg/dL   Nitrite NEGATIVE NEGATIVE   Leukocytes, UA NEGATIVE NEGATIVE   RBC / HPF 0-5 0 - 5 RBC/hpf   WBC, UA 0-5 0 - 5 WBC/hpf   Bacteria, UA NONE SEEN NONE SEEN   Squamous Epithelial / LPF 0-5 0 - 5   Mucus PRESENT     Comment: Performed at [facility], [address] [place], [place] [address]  Comprehensive metabolic panel     Status: Abnormal   Collection Time: 03/22/18  8:47 AM  Result Value Ref Range   Sodium 137 135 - 145 mmol/L   Potassium 3.5 3.5 - 5.1 mmol/L   Chloride 105 101 - 111 mmol/L   CO2 25 22 - 32 mmol/L   Glucose, Bld 230 (H) 65 - 99 mg/dL   BUN 9 6 - 20 mg/dL   Creatinine, Ser 0.57 0.44 - 1.00 mg/dL   Calcium 9.1 8.9 - 10.3 mg/dL   Total Protein 6.7 6.5 - 8.1 g/dL   Albumin 3.0 (L) 3.5 - 5.0 g/dL   AST 15 15 - 41 U/L   ALT 18 14 - 54 U/L   Alkaline Phosphatase 41 38 - 126 U/L   Total Bilirubin 0.8 0.3 - 1.2 mg/dL   GFR calc non Af Amer >60 >60 mL/min   GFR calc Af Amer >60 >60 mL/min    Comment: (NOTE) The eGFR has been calculated using the CKD EPI equation. This calculation has not been validated in all clinical situations. eGFR's persistently <60 mL/min signify possible Chronic Kidney Disease.    Anion gap 7 5 - 15    Comment: Performed at [facility], [address], [place], [place] [address]  CBC WITH DIFFERENTIAL     Status: Abnormal   Collection Time: 03/22/18  8:47 AM  Result Value Ref Range   WBC 19.5 (H) 4.0 - 10.5 K/uL   RBC 4.53 3.87 - 5.11 MIL/uL   Hemoglobin 13.6 12.0 - 15.0 g/dL   HCT 40.9 36.0 - 46.0 %   MCV 90.3 78.0 - 100.0 fL   MCH 30.0 26.0 - 34.0 pg   MCHC 33.3 30.0 - 36.0 g/dL   RDW 13.6 11.5 - 15.5 %   Platelets 231 150 - 400 K/uL   Neutrophils Relative % 83 %   Neutro Abs 16.0 (H) 1.7 - 7.7 K/uL   Lymphocytes Relative 11 %   Lymphs Abs 2.2 0.7 - 4.0 K/uL   Monocytes Relative 5 %   Monocytes Absolute 1.0 0.1 - 1.0 K/uL   Eosinophils Relative 1 %   Eosinophils Absolute 0.2 0.0 - 0.7 K/uL   Basophils Relative 0 %   Basophils Absolute 0.0 0.0 - 0.1 K/uL    Comment: Performed at [facility], [address], [place], [place] [address]  Hemoglobin A1c     Status: Abnormal   Collection Time: 03/22/18  8:47 AM  Result Value  Ref Range   Hgb A1c MFr Bld 7.6 (H) 4.8 - 5.6 %    Comment: (NOTE) Pre diabetes:          5.7%-6.4% Diabetes:              >6.4% Glycemic control for   <7.0% adults with diabetes    Mean Plasma Glucose 171.42 mg/dL    Comment: Performed at [facility] [address], [place], [place] [address]  HIV antibody (Routine Testing)     Status: None   Collection Time: 03/22/18  8:52 AM  Result Value Ref Range   HIV Screen 4th Generation wRfx Non Reactive Non Reactive    Comment: (NOTE) Performed At: [facility] [place], [place] [id number] Rush Farmer MD PJ:[id number] Performed at [facility], [address] [place], [place] [address]   Blood Culture (routine x 2)     Status: None (Preliminary result)   Collection Time: 03/22/18  8:53 AM  Result Value Ref Range   Specimen Description      LEFT ANTECUBITAL BOTTLES DRAWN AEROBIC AND ANAEROBIC   Special Requests      Blood Culture results may not be optimal due to an inadequate volume of blood received in culture bottles   Culture      NO GROWTH 2 DAYS Performed at [facility], [address], [place], [place] [address]    Report Status PENDING   Blood Culture (routine x 2)     Status: None (Preliminary result)   Collection Time: 03/22/18  8:57 AM  Result Value Ref Range   Specimen Description BLOOD LEFT ARM BOTTLES DRAWN AEROBIC AND ANAEROBIC    Special Requests Blood Culture adequate volume    Culture      NO GROWTH 2 DAYS Performed at [facility], [address] [place], [place] [address]    Report Status PENDING   I-Stat CG4 Lactic Acid, ED  (not at  [facility])     Status: None   Collection Time: 03/22/18  9:07 AM  Result Value Ref Range   Lactic Acid, Venous 1.49 0.5 - 1.9 mmol/L  Glucose, capillary     Status: Abnormal   Collection Time: 03/22/18  4:41 PM  Result Value Ref Range   Glucose-Capillary 152 (  H) 65 - 99 mg/dL  Glucose, capillary     Status: Abnormal   Collection Time: 03/22/18   8:57 PM  Result Value Ref Range   Glucose-Capillary 173 (H) 65 - 99 mg/dL   Comment 1 Notify RN    Comment 2 Document in Chart   CBC     Status: Abnormal   Collection Time: 03/23/18  6:01 AM  Result Value Ref Range   WBC 14.6 (H) 4.0 - 10.5 K/uL   RBC 4.32 3.87 - 5.11 MIL/uL   Hemoglobin 13.0 12.0 - 15.0 g/dL   HCT 39.5 36.0 - 46.0 %   MCV 91.4 78.0 - 100.0 fL   MCH 30.1 26.0 - 34.0 pg   MCHC 32.9 30.0 - 36.0 g/dL   RDW 13.7 11.5 - 15.5 %   Platelets 241 150 - 400 K/uL    Comment: Performed at [facility], [address], [place], [place] [address]  Comprehensive metabolic panel     Status: Abnormal   Collection Time: 03/23/18  6:01 AM  Result Value Ref Range   Sodium 138 135 - 145 mmol/L   Potassium 3.5 3.5 - 5.1 mmol/L   Chloride 106 101 - 111 mmol/L   CO2 26 22 - 32 mmol/L   Glucose, Bld 149 (H) 65 - 99 mg/dL   BUN 10 6 - 20 mg/dL   Creatinine, Ser 0.54 0.44 - 1.00 mg/dL   Calcium 8.3 (L) 8.9 - 10.3 mg/dL   Total Protein 6.2 (L) 6.5 - 8.1 g/dL   Albumin 2.7 (L) 3.5 - 5.0 g/dL   AST 12 (L) 15 - 41 U/L   ALT 14 14 - 54 U/L   Alkaline Phosphatase 35 (L) 38 - 126 U/L   Total Bilirubin 0.6 0.3 - 1.2 mg/dL   GFR calc non Af Amer >60 >60 mL/min   GFR calc Af Amer >60 >60 mL/min    Comment: (NOTE) The eGFR has been calculated using the CKD EPI equation. This calculation has not been validated in all clinical situations. eGFR's persistently <60 mL/min signify possible Chronic Kidney Disease.    Anion gap 6 5 - 15    Comment: Performed at [facility], [address] [place], [place] [address]  Glucose, capillary     Status: Abnormal   Collection Time: 03/23/18  8:01 AM  Result Value Ref Range   Glucose-Capillary 140 (H) 65 - 99 mg/dL  Glucose, capillary     Status: Abnormal   Collection Time: 03/23/18 11:13 AM  Result Value Ref Range   Glucose-Capillary 140 (H) 65 - 99 mg/dL  Glucose, capillary     Status: Abnormal   Collection Time: 03/23/18  4:22 PM  Result Value  Ref Range   Glucose-Capillary 138 (H) 65 - 99 mg/dL  Glucose, capillary     Status: Abnormal   Collection Time: 03/23/18 10:05 PM  Result Value Ref Range   Glucose-Capillary 129 (H) 65 - 99 mg/dL   Comment 1 Notify RN    Comment 2 Document in Chart   Basic metabolic panel     Status: Abnormal   Collection Time: 03/24/18  5:55 AM  Result Value Ref Range   Sodium 139 135 - 145 mmol/L   Potassium 3.8 3.5 - 5.1 mmol/L   Chloride 110 101 - 111 mmol/L   CO2 24 22 - 32 mmol/L   Glucose, Bld 141 (H) 65 - 99 mg/dL   BUN 9 6 - 20 mg/dL   Creatinine, Ser 0.86  0.44 - 1.00 mg/dL   Calcium 8.3 (L) 8.9 - 10.3 mg/dL   GFR calc non Af Amer >60 >60 mL/min   GFR calc Af Amer >60 >60 mL/min    Comment: (NOTE) The eGFR has been calculated using the CKD EPI equation. This calculation has not been validated in all clinical situations. eGFR's persistently <60 mL/min signify possible Chronic Kidney Disease.    Anion gap 5 5 - 15    Comment: Performed at [facility], [address], [place], [place] [address]  Glucose, capillary     Status: Abnormal   Collection Time: 03/24/18  7:40 AM  Result Value Ref Range   Glucose-Capillary 136 (H) 65 - 99 mg/dL    ABGS No results for input(s): PHART, PO2ART, TCO2, HCO3 in the last 72 hours.  Invalid input(s): PCO2 CULTURES Recent Results (from the past 240 hour(s))  Blood Culture (routine x 2)     Status: None (Preliminary result)   Collection Time: 03/22/18  8:53 AM  Result Value Ref Range Status   Specimen Description   Final    LEFT ANTECUBITAL BOTTLES DRAWN AEROBIC AND ANAEROBIC   Special Requests   Final    Blood Culture results may not be optimal due to an inadequate volume of blood received in culture bottles   Culture   Final    NO GROWTH 2 DAYS Performed at [facility], [address] [place], [place] [address]    Report Status PENDING  Incomplete  Blood Culture (routine x 2)     Status: None (Preliminary result)   Collection Time:  03/22/18  8:57 AM  Result Value Ref Range Status   Specimen Description BLOOD LEFT ARM BOTTLES DRAWN AEROBIC AND ANAEROBIC  Final   Special Requests Blood Culture adequate volume  Final   Culture   Final    NO GROWTH 2 DAYS Performed at [facility], [address], [place], [place] [address]    Report Status PENDING  Incomplete   Studies/Results: Ct Extremity Lower Left W Contrast  Result Date: 03/23/2018 CLINICAL DATA:  Pain and swelling along the medial aspect of the left thigh for 4-5 days, worsening. EXAM: CT OF THE LOWER LEFT EXTREMITY WITH CONTRAST TECHNIQUE: Multidetector CT imaging of the lower left extremity was performed according to the standard protocol following intravenous contrast administration. COMPARISON:  CT of the left thigh 03/21/2018. CONTRAST:  100 ml ISOVUE-300 IOPAMIDOL (ISOVUE-300) INJECTION 61% FINDINGS: Bones/Joint/Cartilage No bony destructive change or periosteal reaction. No focal lesion. No avascular necrosis of the femoral head, fracture or evidence of stress change. There is some degenerative disease about the knee. Ligaments Suboptimally assessed by CT. Muscles and Tendons Normal without evidence of inflammatory change. No intramuscular abscess. No tear. Soft tissues Stranding of subcutaneous fat centered medial aspect the left thigh has worsened with new stranding identified in subcutaneous fat anteriorly and posteriorly. No focal fluid collection is identified. Thickening of cutaneous tissues is most notable medially. IMPRESSION: Findings consistent with worsened cellulitis of the left upper leg. Negative for abscess or osteomyelitis Electronically Signed   By: Inge Rise M.D.   On: 03/23/2018 10:58    Medications:  Prior to Admission:  Medications Prior to Admission  Medication Sig Dispense Refill Last Dose  . levothyroxine (SYNTHROID, LEVOTHROID) 50 MCG tablet Take 50 mcg by mouth daily.     03/22/2018 at Unknown time  . megestrol (MEGACE) 40 MG tablet  Take 20 mg by mouth daily.    03/22/2018 at Unknown time  .  Multiple Vitamins-Minerals (MULTIVITAMIN ADULT PO) Take 1 tablet by mouth daily.    03/22/2018 at Unknown time  . sertraline (ZOLOFT) 100 MG tablet Take 200 mg by mouth daily.    03/22/2018 at Unknown time  . spironolactone (ALDACTONE) 25 MG tablet Take 25 mg by mouth daily.     03/22/2018 at Unknown time  . Fish Oil-Cholecalciferol (FISH OIL + D3 PO) Take 1 capsule by mouth daily.   Past Week at Unknown time  . ibuprofen (ADVIL,MOTRIN) 600 MG tablet Take 1 tablet (600 mg total) by mouth 4 (four) times daily. (Patient not taking: Reported on 03/22/2018) 30 tablet 0 Completed Course at Unknown time   Scheduled: . enoxaparin (LOVENOX) injection  40 mg Subcutaneous Daily  . insulin aspart  0-9 Units Subcutaneous TID WC  . ketorolac  30 mg Intravenous Q6H  . levothyroxine  50 mcg Oral QAC breakfast  . megestrol  20 mg Oral Daily  . nicotine  21 mg Transdermal Daily  . sertraline  200 mg Oral Daily  . spironolactone  25 mg Oral Daily   Continuous: . sodium chloride 100 mL/hr at 03/24/18 0400  . piperacillin-tazobactam (ZOSYN)  IV 3.375 g (03/24/18 0815)  . vancomycin 1,000 mg (03/24/18 0815)   WOE:[id number] **OR** acetaminophen, ibuprofen  Assesment: She has cellulitis of the leg.  CT did not show an abscess but the cellulitis appeared to be somewhat worse.  She remains on Zosyn and vancomycin. Active Problems:   Cellulitis   Cellulitis and abscess of leg   Hypothyroidism    Plan: Continue treatments.    LOS: 2 days   Dessa Ledee L 03/24/2018, 8:33 AM

## 2018-03-24 NOTE — Progress Notes (Signed)
Inpatient Diabetes Program Recommendations  AACE/ADA: New Consensus Statement on Inpatient Glycemic Control (2019)  Target Ranges:  Prepandial:   less than 140 mg/dL      Peak postprandial:   less than 180 mg/dL (1-2 hours)      Critically ill patients:  140 - 180 mg/dL    Review of Glycemic Control  Diabetes history: NO Outpatient Diabetes medications: NA Current orders for Inpatient glycemic control: Novolog 0-9 units TID with meals  Inpatient Diabetes Program Recommendations: HgbA1C: A1C 7.6% on 03/22/18 indicating an average glucose of 171 mg/dl over the past 2-3 months.   NOTE: Consulted for new DM dx. Spoke with patient about new diabetes diagnosis. Patient reports that she has a strong family hx of DM and endorses symptoms of hyperglycemia over the past 2-3 months (excessive thirst, increased tiredness).  Discussed A1C results (7.6% on 03/22/18) and explained what an A1C is and informed patient that her current A1C indicates an average glucose of 171 mg/dl over the past 2-3 months. Discussed basic pathophysiology of DM Type 2, basic home care, importance of checking CBGs and maintaining good CBG control to prevent long-term and short-term complications. Reviewed glucose and A1C goals.  Reviewed signs and symptoms of hyperglycemia and hypoglycemia along with treatment for both. Discussed impact of nutrition, exercise, stress, sickness, and medications on diabetes control. Discussed Carb Modified diet and portion control. Patient states that she drinks at least 4 regular Dr Alcus Dad per day. Encouraged patient to eliminate sugary beverages and to make dietary changes to moderate carbohydrates. Patient states that she has recently been on steroids. Discussed that steroids have likely caused glucose to be more elevated and contributed to higher A1C as well. Informed patient she will be receiving Living Well with diabetes booklet and encouraged patient to read through entire book once it is  received. Ordered RD consult for further diet education. Discussed Metformin (how it works, side effects) in case patient is discharged on Metformin.    RNs to provide ongoing basic DM education at bedside with this patient.  Thanks, Orlando Penner, RN, MSN, CDE Diabetes Coordinator Inpatient Diabetes Program [phone] (Team Pager from 8am to 5pm)

## 2018-03-24 NOTE — Progress Notes (Signed)
[facility] Progress Note     Subjective: Having some drainage from the upper thigh at the opened ulcerated area. New since yesterday. CT without any drainable collection repeat yesterday. Erythema somewhat worse again inferiorly but pain improved and medial portion better.   Objective: Vital signs in last 24 hours: Temp:  [98.1 F (36.7 C)-98.9 F (37.2 C)] 98.1 F (36.7 C) (05/13 0459) Pulse Rate:  [88-90] 90 (05/13 0459) Resp:  [18-20] 18 (05/13 0459) BP: (132-136)/(70-77) 132/77 (05/13 0459) SpO2:  [98 %-100 %] 98 % (05/13 0459) Last BM Date: 03/23/18  Intake/Output from previous day: 05/12 0701 - 05/13 0700 In: 3690 [P.O.:840; I.V.:2100; IV Piggyback:750] Out: -  Intake/Output this shift: No intake/output data recorded.  General appearance: alert, cooperative and no distress Resp: normal work breathing Extremities: left medial thigh with some seropurulent drainage, expressed some more, erythema slightly worse inferior but improvd medially, some ecchymosis centrally, non tender, no crepitus, no bullae  Lab Results:  Recent Labs    03/22/18 0847 03/23/18 0601  WBC 19.5* 14.6*  HGB 13.6 13.0  HCT 40.9 39.5  PLT 231 241   BMET Recent Labs    03/23/18 0601 03/24/18 0555  NA 138 139  K 3.5 3.8  CL 106 110  CO2 26 24  GLUCOSE 149* 141*  BUN 10 9  CREATININE 0.54 0.86  CALCIUM 8.3* 8.3*   Personally reviewed images- no fluid collections/ abscess, edematous subcutaneous tissue  Studies/Results: Ct Extremity Lower Left W Contrast  Result Date: 03/23/2018 CLINICAL DATA:  Pain and swelling along the medial aspect of the left thigh for 4-5 days, worsening. EXAM: CT OF THE LOWER LEFT EXTREMITY WITH CONTRAST TECHNIQUE: Multidetector CT imaging of the lower left extremity was performed according to the standard protocol following intravenous contrast administration. COMPARISON:  CT of the left thigh 03/21/2018. CONTRAST:  100 ml ISOVUE-300 IOPAMIDOL  (ISOVUE-300) INJECTION 61% FINDINGS: Bones/Joint/Cartilage No bony destructive change or periosteal reaction. No focal lesion. No avascular necrosis of the femoral head, fracture or evidence of stress change. There is some degenerative disease about the knee. Ligaments Suboptimally assessed by CT. Muscles and Tendons Normal without evidence of inflammatory change. No intramuscular abscess. No tear. Soft tissues Stranding of subcutaneous fat centered medial aspect the left thigh has worsened with new stranding identified in subcutaneous fat anteriorly and posteriorly. No focal fluid collection is identified. Thickening of cutaneous tissues is most notable medially. IMPRESSION: Findings consistent with worsened cellulitis of the left upper leg. Negative for abscess or osteomyelitis Electronically Signed   By: Drusilla Kanner M.D.   On: 03/23/2018 10:58    Anti-infectives: Anti-infectives (From admission, onward)   Start     Dose/Rate Route Frequency Ordered Stop   03/22/18 1700  vancomycin (VANCOCIN) IVPB 1000 mg/200 mL premix     1,000 mg 200 mL/hr over 60 Minutes Intravenous Every 8 hours 03/22/18 1442     03/22/18 1600  piperacillin-tazobactam (ZOSYN) IVPB 3.375 g     3.375 g 12.5 mL/hr over 240 Minutes Intravenous Every 8 hours 03/22/18 1442     03/22/18 0845  piperacillin-tazobactam (ZOSYN) IVPB 3.375 g     3.375 g 100 mL/hr over 30 Minutes Intravenous  Once 03/22/18 0837 03/22/18 1000   03/22/18 0845  vancomycin (VANCOCIN) IVPB 1000 mg/200 mL premix     1,000 mg 200 mL/hr over 60 Minutes Intravenous  Once 03/22/18 2841 03/22/18 1102      Assessment/Plan: Maria Vargas is a 52 yo with a left thigh  cellulitis, no evidence yesterday of drainable collection but cellulitis worsening inferiorly. Now with some drainage from the ulcerated portion of the wound, no bullae, no pockets on the CT scan.  -Continue the IV antibiotics, ? Vanc Zosyn is pretty broad and should cover patient  -No signs that  this is a necrotizing infection based on the exam or lab work, may need to consider changing up antibiotics if still no improvement in the next day / discussion with pharmacy -Did add a MRSA PCR screen to see if she is a carrier for MRSA; on Vanc but levels pending    LOS: 2 days    Maria Vargas 03/24/2018

## 2018-03-25 LAB — GLUCOSE, CAPILLARY
GLUCOSE-CAPILLARY: 151 mg/dL — AB (ref 65–99)
GLUCOSE-CAPILLARY: 141 mg/dL — AB (ref 65–99)
Glucose-Capillary: 147 mg/dL — ABNORMAL HIGH (ref 65–99)
Glucose-Capillary: 121 mg/dL — ABNORMAL HIGH (ref 65–99)

## 2018-03-25 LAB — VANCOMYCIN, TROUGH
VANCOMYCIN TR: 25 ug/mL — AB (ref 15–20)
Vancomycin Tr: 25 ug/mL (ref 15–20)

## 2018-03-25 MED ORDER — VANCOMYCIN HCL IN DEXTROSE 1-5 GM/200ML-% IV SOLN: 1000.0000 mg | Freq: Three times a day (TID) | INTRAVENOUS | Status: DC

## 2018-03-25 NOTE — Progress Notes (Signed)
Vanc trough order changed from 0400 to 0800 03/25/18. Next Vanc is due at 0900 03/25/18.

## 2018-03-25 NOTE — Progress Notes (Signed)
[facility] Progress Note     Subjective: Feels better. Leg improved. Some minor drainage.   Objective: Vital signs in last 24 hours: Temp:  [98.9 F (37.2 C)-99.2 F (37.3 C)] 98.9 F (37.2 C) (05/14 0431) Pulse Rate:  [92-93] 93 (05/14 0431) BP: (141-149)/(73-78) 141/73 (05/14 0431) SpO2:  [92 %-99 %] 92 % (05/14 0431) Last BM Date: 03/25/18  Intake/Output from previous day: 05/13 0701 - 05/14 0700 In: 3250 [I.V.:2500; IV Piggyback:750] Out: -  Intake/Output this shift: No intake/output data recorded.  General appearance: alert, cooperative and no distress Resp: normal work breathing Extremities: left thigh cellulitis significantly improved, minimal draingae on the ABD pad, nontender except in the very superior aspect  Lab Results:  Recent Labs    03/22/18 0847 03/23/18 0601  WBC 19.5* 14.6*  HGB 13.6 13.0  HCT 40.9 39.5  PLT 231 241   BMET Recent Labs    03/23/18 0601 03/24/18 0555  NA 138 139  K 3.5 3.8  CL 106 110  CO2 26 24  GLUCOSE 149* 141*  BUN 10 9  CREATININE 0.54 0.86  CALCIUM 8.3* 8.3*   PT/INR No results for input(s): LABPROT, INR in the last 72 hours.  Studies/Results: Ct Extremity Lower Left W Contrast  Result Date: 03/23/2018 CLINICAL DATA:  Pain and swelling along the medial aspect of the left thigh for 4-5 days, worsening. EXAM: CT OF THE LOWER LEFT EXTREMITY WITH CONTRAST TECHNIQUE: Multidetector CT imaging of the lower left extremity was performed according to the standard protocol following intravenous contrast administration. COMPARISON:  CT of the left thigh 03/21/2018. CONTRAST:  100 ml ISOVUE-300 IOPAMIDOL (ISOVUE-300) INJECTION 61% FINDINGS: Bones/Joint/Cartilage No bony destructive change or periosteal reaction. No focal lesion. No avascular necrosis of the femoral head, fracture or evidence of stress change. There is some degenerative disease about the knee. Ligaments Suboptimally assessed by CT. Muscles and  Tendons Normal without evidence of inflammatory change. No intramuscular abscess. No tear. Soft tissues Stranding of subcutaneous fat centered medial aspect the left thigh has worsened with new stranding identified in subcutaneous fat anteriorly and posteriorly. No focal fluid collection is identified. Thickening of cutaneous tissues is most notable medially. IMPRESSION: Findings consistent with worsened cellulitis of the left upper leg. Negative for abscess or osteomyelitis Electronically Signed   By: Drusilla Kanner M.D.   On: 03/23/2018 10:58    Anti-infectives: Anti-infectives (From admission, onward)   Start     Dose/Rate Route Frequency Ordered Stop   03/22/18 1700  vancomycin (VANCOCIN) IVPB 1000 mg/200 mL premix     1,000 mg 200 mL/hr over 60 Minutes Intravenous Every 8 hours 03/22/18 1442     03/22/18 1600  piperacillin-tazobactam (ZOSYN) IVPB 3.375 g     3.375 g 12.5 mL/hr over 240 Minutes Intravenous Every 8 hours 03/22/18 1442     03/22/18 0845  piperacillin-tazobactam (ZOSYN) IVPB 3.375 g     3.375 g 100 mL/hr over 30 Minutes Intravenous  Once 03/22/18 0837 03/22/18 1000   03/22/18 0845  vancomycin (VANCOCIN) IVPB 1000 mg/200 mL premix     1,000 mg 200 mL/hr over 60 Minutes Intravenous  Once 03/22/18 1610 03/22/18 1102      Assessment/Plan: Maria Vargas is a 52 yo with left thigh cellulitis. No abscess on CT but had some drainage spontaneously. -Continue IV antibiotics for 24 more hrs -D/c home tomorrow with orals for total 14 day course -No surgical follow up needed as outpatient     LOS: 3 days  Maria Vargas 03/25/2018

## 2018-03-25 NOTE — Progress Notes (Signed)
Living with Diabetes handout given to patient.

## 2018-03-25 NOTE — Progress Notes (Signed)
Pharmacy Note:  Initial antibiotic(s) regimen of Vancomycin and Zosyn  ordered by EDP to treat Sepsis/Cellulitis.  Estimated Creatinine Clearance: 79.6 mL/min (by C-G formula based on SCr of 0.86 mg/dL).   Allergies  Allergen Reactions  . Amoxicillin-Pot Clavulanate Other (See Comments)    Bad yeast infection    Vitals:   03/25/18 1332 03/25/18 1334  BP: (!) 180/93 (!) 171/87  Pulse: 87   Resp: 18   Temp: 98.5 F (36.9 C)   SpO2: 100%     Anti-infectives (From admission, onward)   Start     Dose/Rate Route Frequency Ordered Stop   03/25/18 1600  vancomycin (VANCOCIN) IVPB 1000 mg/200 mL premix     1,000 mg 200 mL/hr over 60 Minutes Intravenous Every 8 hours 03/25/18 0910     03/22/18 1700  vancomycin (VANCOCIN) IVPB 1000 mg/200 mL premix  Status:  Discontinued     1,000 mg 200 mL/hr over 60 Minutes Intravenous Every 8 hours 03/22/18 1442 03/25/18 0907   03/22/18 1600  piperacillin-tazobactam (ZOSYN) IVPB 3.375 g     3.375 g 12.5 mL/hr over 240 Minutes Intravenous Every 8 hours 03/22/18 1442     03/22/18 0845  piperacillin-tazobactam (ZOSYN) IVPB 3.375 g     3.375 g 100 mL/hr over 30 Minutes Intravenous  Once 03/22/18 0837 03/22/18 1000   03/22/18 0845  vancomycin (VANCOCIN) IVPB 1000 mg/200 mL premix     1,000 mg 200 mL/hr over 60 Minutes Intravenous  Once 03/22/18 0837 03/22/18 1102     Trough elevated this AM, but drawn after dose started per report.  Repeat trough = 24.6 (per lab, not resulted in CHL yet. Likely home on oral ABX tomorrow per surgery.    Plan: D/C Vanc current orders Zosyn 3.375gm IV every 8 hours. Follow-up micro data, labs, vitals.  If plan changes and Vancomycin to continue in AM, will need to order new regimen.  Mady Gemma, [facility] 03/25/2018 7:44 PM

## 2018-03-25 NOTE — Progress Notes (Signed)
Subjective: She says she feels better and wants to go home.  She has no new complaints.  Objective: Vital signs in last 24 hours: Temp:  [98.9 F (37.2 C)-99.2 F (37.3 C)] 98.9 F (37.2 C) (05/14 0431) Pulse Rate:  [92-93] 93 (05/14 0431) BP: (141-149)/(73-78) 141/73 (05/14 0431) SpO2:  [92 %-99 %] 92 % (05/14 0431) Weight change:  Last BM Date: 03/25/18  Intake/Output from previous day: 05/13 0701 - 05/14 0700 In: 3250 [I.V.:2500; IV Piggyback:750] Out: -   PHYSICAL EXAM General appearance: alert, cooperative and no distress Resp: clear to auscultation bilaterally Cardio: regular rate and rhythm, S1, S2 normal, no murmur, click, rub or gallop GI: soft, non-tender; bowel sounds normal; no masses,  no organomegaly Extremities: Her left thigh cellulitis is markedly improved.  She has some drainage.  Much less erythema much less warmth.  Lab Results:  Results for orders placed or performed during the hospital encounter of 03/22/18 (from the past 48 hour(s))  Glucose, capillary     Status: Abnormal   Collection Time: 03/23/18 11:13 AM  Result Value Ref Range   Glucose-Capillary 140 (H) 65 - 99 mg/dL  Glucose, capillary     Status: Abnormal   Collection Time: 03/23/18  4:22 PM  Result Value Ref Range   Glucose-Capillary 138 (H) 65 - 99 mg/dL  Glucose, capillary     Status: Abnormal   Collection Time: 03/23/18 10:05 PM  Result Value Ref Range   Glucose-Capillary 129 (H) 65 - 99 mg/dL   Comment 1 Notify RN    Comment 2 Document in Chart   Basic metabolic panel     Status: Abnormal   Collection Time: 03/24/18  5:55 AM  Result Value Ref Range   Sodium 139 135 - 145 mmol/L   Potassium 3.8 3.5 - 5.1 mmol/L   Chloride 110 101 - 111 mmol/L   CO2 24 22 - 32 mmol/L   Glucose, Bld 141 (H) 65 - 99 mg/dL   BUN 9 6 - 20 mg/dL   Creatinine, Ser 0.86 0.44 - 1.00 mg/dL   Calcium 8.3 (L) 8.9 - 10.3 mg/dL   GFR calc non Af Amer >60 >60 mL/min   GFR calc Af Amer >60 >60 mL/min   Comment: (NOTE) The eGFR has been calculated using the CKD EPI equation. This calculation has not been validated in all clinical situations. eGFR's persistently <60 mL/min signify possible Chronic Kidney Disease.    Anion gap 5 5 - 15    Comment: Performed at [facility], [address] [place], [place] [address]  Glucose, capillary     Status: Abnormal   Collection Time: 03/24/18  7:40 AM  Result Value Ref Range   Glucose-Capillary 136 (H) 65 - 99 mg/dL  Glucose, capillary     Status: Abnormal   Collection Time: 03/24/18 11:52 AM  Result Value Ref Range   Glucose-Capillary 124 (H) 65 - 99 mg/dL   Comment 1 Notify RN    Comment 2 Document in Chart   MRSA PCR Screening     Status: None   Collection Time: 03/24/18  2:53 PM  Result Value Ref Range   MRSA by PCR NEGATIVE NEGATIVE    Comment:        The GeneXpert MRSA Assay (FDA approved for NASAL specimens only), is one component of a comprehensive MRSA colonization surveillance program. It is not intended to diagnose MRSA infection nor to guide or monitor treatment for MRSA infections. Performed at [facility], [phone]  [address], [place], [place] [address]   Glucose, capillary     Status: Abnormal   Collection Time: 03/24/18  5:35 PM  Result Value Ref Range   Glucose-Capillary 152 (H) 65 - 99 mg/dL  Glucose, capillary     Status: Abnormal   Collection Time: 03/24/18  9:11 PM  Result Value Ref Range   Glucose-Capillary 149 (H) 65 - 99 mg/dL  Glucose, capillary     Status: Abnormal   Collection Time: 03/25/18  8:19 AM  Result Value Ref Range   Glucose-Capillary 151 (H) 65 - 99 mg/dL   Comment 1 Notify RN     ABGS No results for input(s): PHART, PO2ART, TCO2, HCO3 in the last 72 hours.  Invalid input(s): PCO2 CULTURES Recent Results (from the past 240 hour(s))  Blood Culture (routine x 2)     Status: None (Preliminary result)   Collection Time: 03/22/18  8:53 AM  Result Value Ref Range Status   Specimen Description    Final    LEFT ANTECUBITAL BOTTLES DRAWN AEROBIC AND ANAEROBIC   Special Requests   Final    Blood Culture results may not be optimal due to an inadequate volume of blood received in culture bottles   Culture   Final    NO GROWTH 3 DAYS Performed at [facility], [address] [place], [place] [address]    Report Status PENDING  Incomplete  Blood Culture (routine x 2)     Status: None (Preliminary result)   Collection Time: 03/22/18  8:57 AM  Result Value Ref Range Status   Specimen Description BLOOD LEFT ARM BOTTLES DRAWN AEROBIC AND ANAEROBIC  Final   Special Requests Blood Culture adequate volume  Final   Culture   Final    NO GROWTH 3 DAYS Performed at [facility], [address], [place], [place] [address]    Report Status PENDING  Incomplete  MRSA PCR Screening     Status: None   Collection Time: 03/24/18  2:53 PM  Result Value Ref Range Status   MRSA by PCR NEGATIVE NEGATIVE Final    Comment:        The GeneXpert MRSA Assay (FDA approved for NASAL specimens only), is one component of a comprehensive MRSA colonization surveillance program. It is not intended to diagnose MRSA infection nor to guide or monitor treatment for MRSA infections. Performed at [facility], [address], [place], [place] [address]    Studies/Results: Ct Extremity Lower Left W Contrast  Result Date: 03/23/2018 CLINICAL DATA:  Pain and swelling along the medial aspect of the left thigh for 4-5 days, worsening. EXAM: CT OF THE LOWER LEFT EXTREMITY WITH CONTRAST TECHNIQUE: Multidetector CT imaging of the lower left extremity was performed according to the standard protocol following intravenous contrast administration. COMPARISON:  CT of the left thigh 03/21/2018. CONTRAST:  100 ml ISOVUE-300 IOPAMIDOL (ISOVUE-300) INJECTION 61% FINDINGS: Bones/Joint/Cartilage No bony destructive change or periosteal reaction. No focal lesion. No avascular necrosis of the femoral head, fracture or evidence of stress  change. There is some degenerative disease about the knee. Ligaments Suboptimally assessed by CT. Muscles and Tendons Normal without evidence of inflammatory change. No intramuscular abscess. No tear. Soft tissues Stranding of subcutaneous fat centered medial aspect the left thigh has worsened with new stranding identified in subcutaneous fat anteriorly and posteriorly. No focal fluid collection is identified. Thickening of cutaneous tissues is most notable medially. IMPRESSION: Findings consistent with worsened cellulitis of the left upper leg. Negative for abscess or  osteomyelitis Electronically Signed   By: Inge Rise M.D.   On: 03/23/2018 10:58    Medications:  Prior to Admission:  Medications Prior to Admission  Medication Sig Dispense Refill Last Dose  . levothyroxine (SYNTHROID, LEVOTHROID) 50 MCG tablet Take 50 mcg by mouth daily.     03/22/2018 at Unknown time  . megestrol (MEGACE) 40 MG tablet Take 20 mg by mouth daily.    03/22/2018 at Unknown time  . Multiple Vitamins-Minerals (MULTIVITAMIN ADULT PO) Take 1 tablet by mouth daily.    03/22/2018 at Unknown time  . sertraline (ZOLOFT) 100 MG tablet Take 200 mg by mouth daily.    03/22/2018 at Unknown time  . spironolactone (ALDACTONE) 25 MG tablet Take 25 mg by mouth daily.     03/22/2018 at Unknown time  . Fish Oil-Cholecalciferol (FISH OIL + D3 PO) Take 1 capsule by mouth daily.   Past Week at Unknown time  . ibuprofen (ADVIL,MOTRIN) 600 MG tablet Take 1 tablet (600 mg total) by mouth 4 (four) times daily. (Patient not taking: Reported on 03/22/2018) 30 tablet 0 Completed Course at Unknown time   Scheduled: . enoxaparin (LOVENOX) injection  40 mg Subcutaneous Daily  . insulin aspart  0-9 Units Subcutaneous TID WC  . levothyroxine  50 mcg Oral QAC breakfast  . living well with diabetes book   Does not apply Once  . megestrol  20 mg Oral Daily  . nicotine  21 mg Transdermal Daily  . sertraline  200 mg Oral Daily  . spironolactone  25  mg Oral Daily   Continuous: . sodium chloride 100 mL/hr at 03/25/18 0645  . piperacillin-tazobactam (ZOSYN)  IV Stopped (03/25/18 0333)  . vancomycin 1,000 mg (03/25/18 0803)   [id number] **OR** acetaminophen, ibuprofen  Assesment: She has cellulitis of her leg.  She has a new diagnosis of diabetes.  She has hypothyroidism.  Her cellulitis is much improved and she wants to go home but I think she needs to stay for 1 more day of IV antibiotics considering how much difficulty she is had with this Active Problems:   Cellulitis   Cellulitis and abscess of leg   Hypothyroidism    Plan: Continue IV antibiotics today likely discharge tomorrow    LOS: 3 days   Cherre Kothari L 03/25/2018, 8:44 AM

## 2018-03-25 NOTE — Progress Notes (Signed)
Initial Nutrition Assessment  DOCUMENTATION CODES:      INTERVENTION:  Provided DM education    NUTRITION DIAGNOSIS:   Food and nutrition related knowledge deficit related to (no previous need for cho modified diet. ) as evidenced by (S) (New DM diagnosis. A1C-7.6% ).   GOAL:  (Patient will demonstate basic cho knowledge)   MONITOR:  (CBG's, diet compliance, weight trends)  REASON FOR ASSESSMENT:   Consult Diet education  ASSESSMENT:  Maria Vargas has cellulitis left thigh drainage present noted on 5/13 th. CT obtained- no abscesses found. No surgical intervention required per MD. Has been taking steroids. Patient is newly diagnosed Diabetes type 2. Received education both from Diabetes coordinator and RD. Living Well with Diabetes Booklet and Handout: CHO Counting for People with Diabetes reviewed. Discussed label reading, meal planning.  Patient frequently eats fast food options such as [employer]. We talked healthy options to modify sodium and cho intake. She has been drinking regular soft drinks -we talked about ways to wean from sugar sweetened beverages. Finally, she often eats ice cream as a snack at night- discussed alternate choices to help with portion control. Patient weight hx has been stable around 215-216 lb for the past 3 years. Weight loss of 5-10% body (15-20 lb) would be beneficial for her. Expect patient will decrease her overall weight if she chooses to transition off regular soft drinks since this is a source of significant excess calories.    CBG (last 3)  Recent Labs    03/24/18 2111 03/25/18 0819 03/25/18 1135  GLUCAP 149* 151* 147*     BMP Latest Ref Rng & Units 03/24/2018 03/23/2018 03/22/2018  Glucose 65 - 99 mg/dL 161(W) 960(A) 540(J)  BUN 6 - 20 mg/dL Creatinine 0.44 - 1.00 mg/dL 8.11 9.14 7.82  Sodium 135 - 145 mmol/L 139 138 137  Potassium 3.5 - 5.1 mmol/L 3.8 3.5 3.5  Chloride 101 - 111 mmol/L 110 106 105  CO2 22 - 32 mmol/L Calcium 8.9 - 10.3 mg/dL 8.3(L) 8.3(L) 9.1    NUTRITION - FOCUSED PHYSICAL EXAM: deferred  Diet Order:   Diet Order           Diet regular Room service appropriate? Yes; Fluid consistency: Thin  Diet effective now          EDUCATION NEEDS:   Education needs have been addressed Skin:  Skin Assessment: Skin Integrity Issues: Skin Integrity Issues:: (Cellulitis left thigh)  Last BM:  5/14   Height:   Ht Readings from Last 1 Encounters:  03/22/18  (1.473 m)    Weight:   Wt Readings from Last 1 Encounters:  03/22/18 216 lb 0.8 oz (98 kg)    Ideal Body Weight:  43 kg  BMI:  Body mass index is 45.15 kg/m.  Estimated Nutritional Needs:   Kcal:  1400-1600   Protein:  90-99 gr  Fluid:  >1600 ml fluid   Royann Shivers Maria,RD,CSG,LDN Office: [phone] Pager: [phone]

## 2018-03-26 LAB — BASIC METABOLIC PANEL
Anion gap: 5 (ref 5–15)
BUN: 10 mg/dL (ref 6–20)
CHLORIDE: 111 mmol/L (ref 101–111)
CO2: 24 mmol/L (ref 22–32)
Calcium: 8.4 mg/dL — ABNORMAL LOW (ref 8.9–10.3)
Creatinine, Ser: 1.06 mg/dL — ABNORMAL HIGH (ref 0.44–1.00)
GFR calc Af Amer: >60 mL/min (ref >60)
GLUCOSE: 121 mg/dL — AB (ref 65–99)
POTASSIUM: 3.6 mmol/L (ref 3.5–5.1)
Sodium: 140 mmol/L (ref 135–145)

## 2018-03-26 LAB — GLUCOSE, CAPILLARY: Glucose-Capillary: 151 mg/dL — ABNORMAL HIGH (ref 65–99)

## 2018-03-26 MED ORDER — NICOTINE 21 MG/24HR TD PT24: 21.0000 mg | MEDICATED_PATCH | Freq: Every day | TRANSDERMAL | 0 refills | Status: DC

## 2018-03-26 MED ORDER — METFORMIN HCL 500 MG PO TABS: 500.0000 mg | ORAL_TABLET | Freq: Two times a day (BID) | ORAL | 11 refills | Status: AC

## 2018-03-26 NOTE — Progress Notes (Signed)
She feels much better and wants to go home.  Her leg looks better.  She has developed diabetes.  Her blood pressure has been up but she says it is been good at home so ongoing to monitor it and see if she needs treatment.  I think she is okay for discharge today.  Please see discharge summary for details

## 2018-03-26 NOTE — Progress Notes (Signed)
IV removed, WNL. ABD pad changed on left thigh. D/C instructions given to pt. Verbalized understanding. Pt to be wheeled down to main entrance as patient drove themselves.

## 2018-03-27 LAB — CULTURE, BLOOD (ROUTINE X 2)
CULTURE: NO GROWTH
CULTURE: NO GROWTH
Special Requests: ADEQUATE

## 2018-03-29 ENCOUNTER — Other Ambulatory Visit: Payer: Self-pay

## 2018-03-29 ENCOUNTER — Encounter (HOSPITAL_COMMUNITY): Payer: Self-pay | Admitting: Emergency Medicine

## 2018-03-29 ENCOUNTER — Emergency Department (HOSPITAL_COMMUNITY)
Admission: EM | Admit: 2018-03-29 | Discharge: 2018-03-29 | Disposition: A | Discharge status: Home / Self Care | Payer: Medicaid Other | Attending: Emergency Medicine | Admitting: Emergency Medicine

## 2018-03-29 DIAGNOSIS — Z7984 Long term (current) use of oral hypoglycemic drugs: Secondary | ICD-10-CM | POA: Insufficient documentation

## 2018-03-29 DIAGNOSIS — E039 Hypothyroidism, unspecified: Secondary | ICD-10-CM | POA: Insufficient documentation

## 2018-03-29 DIAGNOSIS — Y829 Unspecified medical devices associated with adverse incidents: Secondary | ICD-10-CM | POA: Insufficient documentation

## 2018-03-29 DIAGNOSIS — T7840XA Allergy, unspecified, initial encounter: Secondary | ICD-10-CM

## 2018-03-29 DIAGNOSIS — I10 Essential (primary) hypertension: Secondary | ICD-10-CM | POA: Insufficient documentation

## 2018-03-29 DIAGNOSIS — T887XXA Unspecified adverse effect of drug or medicament, initial encounter: Secondary | ICD-10-CM | POA: Insufficient documentation

## 2018-03-29 DIAGNOSIS — Z79899 Other long term (current) drug therapy: Secondary | ICD-10-CM | POA: Insufficient documentation

## 2018-03-29 DIAGNOSIS — E119 Type 2 diabetes mellitus without complications: Secondary | ICD-10-CM | POA: Insufficient documentation

## 2018-03-29 DIAGNOSIS — J45909 Unspecified asthma, uncomplicated: Secondary | ICD-10-CM | POA: Insufficient documentation

## 2018-03-29 DIAGNOSIS — F17210 Nicotine dependence, cigarettes, uncomplicated: Secondary | ICD-10-CM | POA: Insufficient documentation

## 2018-03-29 HISTORY — DX: Type 2 diabetes mellitus without complications: E11.9

## 2018-03-29 LAB — CBG MONITORING, ED: Glucose-Capillary: 145 mg/dL — ABNORMAL HIGH (ref 65–99)

## 2018-03-29 MED ORDER — DOXYCYCLINE HYCLATE 100 MG PO CAPS: 100.0000 mg | ORAL_CAPSULE | Freq: Two times a day (BID) | ORAL | 0 refills | Status: DC

## 2018-03-29 NOTE — Discharge Instructions (Signed)
Stop taking the clindamycin as discussed, instead finish the course of doxycycline you received here.  I recommend benadryl every 6 hours as needed for itching. Return here if your rash worsens in any way or you develop cough, shortness of breath or face or mouth swelling.  Your blood pressure is elevated today as discussed. Please have this rechecked within the week to make sure it is improved and see your doctor for a recheck if it persists.

## 2018-03-29 NOTE — ED Notes (Signed)
Pt reports that she was recently admitted and given clindamycin and metformin as new drugs She reports admission for cellulitis Now with rash that is raised reddened to her arms and truck She has had no benadryl

## 2018-03-29 NOTE — ED Provider Notes (Signed)
Patient with diffuse rash chest and extremitieswhich started a few hours ago. She is on clindamycin for cellulitis at left groin.  She states that cellulitis is much improved. She is alert and nontoxic-appearing. No mucosal lesions. Favor drug rash. Also suggest blood pressure recheck one week   Doug Sou, MD 03/29/18 2045

## 2018-03-29 NOTE — ED Triage Notes (Signed)
Rash to arms chest and back started a few hours ago

## 2018-03-29 NOTE — ED Notes (Signed)
2x2 application to L upper inner leg

## 2018-03-29 NOTE — ED Provider Notes (Signed)
[facility] EMERGENCY DEPARTMENT Provider Note   CSN: [id number] Arrival date & time: 03/29/18  1754     History   Chief Complaint Chief Complaint  Patient presents with  . Rash    HPI Maria Vargas is a 52 y.o. female who is a new diabetic and recently discharged from the hospital where she was treated for a left upper thigh cellulitis which was not responding to outpatient clindamycin.  She had a 2-day course of clindamycin prior to being admitted on May 11, received IV.  Vancomycin during admission, and then was placed back on her clindamycin when she was discharged from here 3 days ago.  She has developed a rash on her back abdomen arms and upper thighs several hours ago which has been itchy.  She denies cough, shortness of breath or wheezing.  Also has no mouth tongue or throat swelling.  She has had no medications prior to arrival but is concerned about possible allergic reaction to the clindamycin.  Of note, she was also newly started on metformin during her admission for new onset diabetes.  She states her cellulitis of her left upper thigh is not completely resolved but is much improved since upon admission.  The history is provided by the patient.    Past Medical History:  Diagnosis Date  . Anxiety   . Asthma   . Depression   . Diabetes mellitus without complication (HCC)   . Hypertension   . Thyroid disease     Patient Active Problem List   Diagnosis Date Noted  . Cellulitis 03/22/2018  . Hypothyroidism 03/22/2018  . Cellulitis and abscess of leg     Past Surgical History:  Procedure Laterality Date  . CESAREAN SECTION    . CYSTOSCOPY W/ DILATION OF BLADDER     x2  . ENDOMETRIAL ABLATION    . HYSTEROSCOPY    . TONSILLECTOMY       OB History   None      Home Medications    Prior to Admission medications   Medication Sig Start Date End Date Taking? Authorizing Provider  doxycycline (VIBRAMYCIN) 100 MG capsule Take 1 capsule (100 mg total) by  mouth 2 (two) times daily. 03/29/18   Burgess Amor, PA-C  Fish Oil-Cholecalciferol (FISH OIL + D3 PO) Take 1 capsule by mouth daily.    [provider]  ibuprofen (ADVIL,MOTRIN) 600 MG tablet Take 1 tablet (600 mg total) by mouth 4 (four) times daily. Patient not taking: Reported on 03/22/2018 12/02/17   Ivery Quale, PA-C  levothyroxine (SYNTHROID, LEVOTHROID) 50 MCG tablet Take 50 mcg by mouth daily.      [provider]  megestrol (MEGACE) 40 MG tablet Take 20 mg by mouth daily.     [provider]  metFORMIN (GLUCOPHAGE) 500 MG tablet Take 1 tablet (500 mg total) by mouth 2 (two) times daily with a meal. 03/26/18 03/26/19  Kari Baars, MD  Multiple Vitamins-Minerals (MULTIVITAMIN ADULT PO) Take 1 tablet by mouth daily.     [provider]  nicotine (NICODERM CQ - DOSED IN MG/24 HOURS) 21 mg/24hr patch Place 1 patch (21 mg total) onto the skin daily. 03/27/18   Kari Baars, MD  sertraline (ZOLOFT) 100 MG tablet Take 200 mg by mouth daily.     [provider]  spironolactone (ALDACTONE) 25 MG tablet Take 25 mg by mouth daily.      [provider]    Family History No family history on file.  Social History Social History   Tobacco Use  . Smoking status: Current Every Day Smoker    Packs/day: 1.00    Types: Cigarettes  . Smokeless tobacco: Never Used  Substance Use Topics  . Alcohol use: Yes    Comment: rarely  . Drug use: Never     Allergies   Amoxicillin-pot clavulanate   Review of Systems Review of Systems  Constitutional: Negative for chills, fatigue and fever.  Respiratory: Negative for shortness of breath and wheezing.   Gastrointestinal: Negative for nausea and vomiting.  Musculoskeletal: Negative.  Negative for arthralgias and myalgias.  Skin: Positive for rash.  Neurological: Negative for weakness and numbness.     Physical Exam Updated Vital Signs BP (!) 173/89 (BP Location: Right Arm)   Pulse 93    Temp 99.1 F (37.3 C) (Oral)   Resp 16   Ht  (1.473 m)   Wt 98 kg (216 lb)   SpO2 100%   BMI 45.14 kg/m   Physical Exam  Constitutional: She appears well-developed and well-nourished. No distress.  HENT:  Head: Normocephalic.  Mouth/Throat: Oropharynx is clear and moist.  Neck: Neck supple.  Cardiovascular: Normal rate.  Pulmonary/Chest: Effort normal. She has no wheezes.  Musculoskeletal: Normal range of motion. She exhibits no edema.  Lymphadenopathy:    She has no cervical adenopathy.  Skin: Rash noted. Rash is papular.  Diffuse papular, erythematous rash trunk including back and proximal extremities.  No urticarial lesions.  Blanching. Dry. Small area of erythema left upper medial thigh at site of previous cellulitis. Nontender, no induration.      ED Treatments / Results  Labs (all labs ordered are listed, but only abnormal results are displayed) Labs Reviewed  CBG MONITORING, ED - Abnormal; Notable for the following components:      Result Value   Glucose-Capillary 145 (*)    All other components within normal limits    EKG None  Radiology No results found.  Procedures Procedures (including critical care time)  Medications Ordered in ED Medications - No data to display   Initial Impression / Assessment and Plan / ED Course  I have reviewed the triage vital signs and the nursing notes.  Pertinent labs & imaging results that were available during my care of the patient were reviewed by me and considered in my medical decision making (see chart for details).     Pt was also seen by Dr. Ethelda Chick during this exam.  Exam and hx c/w drug reaction. She was switched to doxycycline, advised to stop taking clindamycin.  Benadryl for itching, cool compresses or bath soaks for itching. Strict return precautions discussed. F/u with pcp for a recheck of bp within 1 week.  Final Clinical Impressions(s) / ED Diagnoses   Final diagnoses:  Allergic reaction to  drug, initial encounter    ED Discharge Orders        Ordered    doxycycline (VIBRAMYCIN) 100 MG capsule  2 times daily     03/29/18 2040       Burgess Amor, PA-C 03/30/18 1610    Doug Sou, MD 03/30/18 [phone]

## 2018-03-30 NOTE — Discharge Summary (Signed)
Physician Discharge Summary  Patient ID: Maria Vargas MRN: [id number] DOB/AGE: 50/50/1967 50 y.o. Primary Care Physician:Tripton Ned, Ramon Dredge, MD Admit date: 03/22/2018 Discharge date: 03/30/2018    Discharge Diagnoses:   Active Problems:   Cellulitis   Cellulitis and abscess of leg   Hypothyroidism Obesity Diabetes mellitus type 2 Hypertension  Allergies as of 03/26/2018      Reactions   Amoxicillin-pot Clavulanate Other (See Comments)   Bad yeast infection      Medication List    TAKE these medications   FISH OIL + D3 PO Take 1 capsule by mouth daily.   ibuprofen 600 MG tablet Commonly known as:  ADVIL,MOTRIN Take 1 tablet (600 mg total) by mouth 4 (four) times daily.   levothyroxine 50 MCG tablet Commonly known as:  SYNTHROID, LEVOTHROID Take 50 mcg by mouth daily.   megestrol 40 MG tablet Commonly known as:  MEGACE Take 20 mg by mouth daily.   metFORMIN 500 MG tablet Commonly known as:  GLUCOPHAGE Take 1 tablet (500 mg total) by mouth 2 (two) times daily with a meal.   MULTIVITAMIN ADULT PO Take 1 tablet by mouth daily.   nicotine 21 mg/24hr patch Commonly known as:  NICODERM CQ - dosed in mg/24 hours Place 1 patch (21 mg total) onto the skin daily.   sertraline 100 MG tablet Commonly known as:  ZOLOFT Take 200 mg by mouth daily.   spironolactone 25 MG tablet Commonly known as:  ALDACTONE Take 25 mg by mouth daily.       Discharged Condition: Improved    Consults: General surgery Dr. Henreitta Leber  Significant Diagnostic Studies: Ct Extremity Lower Left W Contrast  Result Date: 03/23/2018 CLINICAL DATA:  Pain and swelling along the medial aspect of the left thigh for 4-5 days, worsening. EXAM: CT OF THE LOWER LEFT EXTREMITY WITH CONTRAST TECHNIQUE: Multidetector CT imaging of the lower left extremity was performed according to the standard protocol following intravenous contrast administration. COMPARISON:  CT of the left thigh 03/21/2018.  CONTRAST:  100 ml ISOVUE-300 IOPAMIDOL (ISOVUE-300) INJECTION 61% FINDINGS: Bones/Joint/Cartilage No bony destructive change or periosteal reaction. No focal lesion. No avascular necrosis of the femoral head, fracture or evidence of stress change. There is some degenerative disease about the knee. Ligaments Suboptimally assessed by CT. Muscles and Tendons Normal without evidence of inflammatory change. No intramuscular abscess. No tear. Soft tissues Stranding of subcutaneous fat centered medial aspect the left thigh has worsened with new stranding identified in subcutaneous fat anteriorly and posteriorly. No focal fluid collection is identified. Thickening of cutaneous tissues is most notable medially. IMPRESSION: Findings consistent with worsened cellulitis of the left upper leg. Negative for abscess or osteomyelitis Electronically Signed   By: Drusilla Kanner M.D.   On: 03/23/2018 10:58   Ct Extremity Lower Left W Contrast  Result Date: 03/21/2018 CLINICAL DATA:  Pain and swelling along the medial aspect of the left thigh for 2 days. Question abscess. EXAM: CT OF THE LOWER LEFT EXTREMITY WITH CONTRAST TECHNIQUE: Multidetector CT imaging of the lower left extremity was performed according to the standard protocol following intravenous contrast administration. COMPARISON:  None. CONTRAST:  100 ml ISOVUE-300 IOPAMIDOL (ISOVUE-300) INJECTION 61% FINDINGS: Bones/Joint/Cartilage Normal. No fracture, periosteal reaction, bony destructive change or focal lesion. Ligaments Normal. Muscles and Tendons Normal. Fat planes are preserved. No intramuscular edema or fluid collection. Soft tissues There is stranding in subcutaneous fat of the medial left thigh and cutaneous tissues are thickened. Imaged intrapelvic contents appear normal. IMPRESSION:  Findings consistent with cellulitis of the medial left thigh. Negative for abscess, osteomyelitis or myositis. Electronically Signed   By: Drusilla Kanner M.D.   On: 03/21/2018  13:31    Lab Results: Basic Metabolic Panel: No results for input(s): NA, K, CL, CO2, GLUCOSE, BUN, CREATININE, CALCIUM, MG, PHOS in the last 72 hours. Liver Function Tests: No results for input(s): AST, ALT, ALKPHOS, BILITOT, PROT, ALBUMIN in the last 72 hours.   CBC: No results for input(s): WBC, NEUTROABS, HGB, HCT, MCV, PLT in the last 72 hours.  Recent Results (from the past 240 hour(s))  Blood Culture (routine x 2)     Status: None   Collection Time: 03/22/18  8:53 AM  Result Value Ref Range Status   Specimen Description   Final    LEFT ANTECUBITAL BOTTLES DRAWN AEROBIC AND ANAEROBIC   Special Requests   Final    Blood Culture results may not be optimal due to an inadequate volume of blood received in culture bottles   Culture   Final    NO GROWTH 5 DAYS Performed at [facility], [address], [place], [place] [address]    Report Status 03/27/2018 FINAL  Final  Blood Culture (routine x 2)     Status: None   Collection Time: 03/22/18  8:57 AM  Result Value Ref Range Status   Specimen Description BLOOD LEFT ARM BOTTLES DRAWN AEROBIC AND ANAEROBIC  Final   Special Requests Blood Culture adequate volume  Final   Culture   Final    NO GROWTH 5 DAYS Performed at [facility], [address] [place], [place] [address]    Report Status 03/27/2018 FINAL  Final  MRSA PCR Screening     Status: None   Collection Time: 03/24/18  2:53 PM  Result Value Ref Range Status   MRSA by PCR NEGATIVE NEGATIVE Final    Comment:        The GeneXpert MRSA Assay (FDA approved for NASAL specimens only), is one component of a comprehensive MRSA colonization surveillance program. It is not intended to diagnose MRSA infection nor to guide or monitor treatment for MRSA infections. Performed at [facility], [address], [place], [place] [address]      Hospital Course: This is a 52 year old who noticed that she had swelling and erythema of her left upper leg..  She came to the emergency  department was treated and released but continued having more trouble and came back to the hospital.  Her erythema had spread.  She was admitted to the hospital and started on vancomycin and Zosyn for presumed sepsis.  Her blood sugar was up and she had elevated hemoglobin A1c so she was diagnosed with diabetes.  Her blood pressure was up that seem to be particularly associated with pain.  She had consultation with general surgery Dr. Henreitta Leber but it did not seem that she needed any sort of drainage.  She eventually improved dramatically and was ready to go home.  Discharge Exam: Blood pressure (!) 145/80, pulse 81, temperature 98.4 F (36.9 C), temperature source Oral, resp. rate 19, height  (1.473 m), weight 98 kg (216 lb 0.8 oz), SpO2 99 %. She is awake and alert.  Her leg is still swollen but much less.  Erythema has faded.  Disposition: Home.  She will follow-up in my office on the diabetes and hypertension and cellulitis.      Signed: Tahj Lindseth L   03/30/2018, 9:18 AM

## 2018-06-17 ENCOUNTER — Inpatient Hospital Stay (HOSPITAL_COMMUNITY)
Admission: EM | Admit: 2018-06-17 | Discharge: 2018-06-20 | DRG: 603 | Disposition: A | Discharge status: Home / Self Care | Payer: Self-pay | Attending: Pulmonary Disease | Admitting: Pulmonary Disease

## 2018-06-17 ENCOUNTER — Encounter (HOSPITAL_COMMUNITY): Payer: Self-pay | Admitting: Emergency Medicine

## 2018-06-17 ENCOUNTER — Other Ambulatory Visit: Payer: Self-pay

## 2018-06-17 DIAGNOSIS — F418 Other specified anxiety disorders: Secondary | ICD-10-CM | POA: Diagnosis present

## 2018-06-17 DIAGNOSIS — J45909 Unspecified asthma, uncomplicated: Secondary | ICD-10-CM | POA: Diagnosis present

## 2018-06-17 DIAGNOSIS — F17210 Nicotine dependence, cigarettes, uncomplicated: Secondary | ICD-10-CM | POA: Diagnosis present

## 2018-06-17 DIAGNOSIS — Z7984 Long term (current) use of oral hypoglycemic drugs: Secondary | ICD-10-CM

## 2018-06-17 DIAGNOSIS — E039 Hypothyroidism, unspecified: Secondary | ICD-10-CM | POA: Diagnosis present

## 2018-06-17 DIAGNOSIS — Z881 Allergy status to other antibiotic agents status: Secondary | ICD-10-CM

## 2018-06-17 DIAGNOSIS — E119 Type 2 diabetes mellitus without complications: Secondary | ICD-10-CM | POA: Diagnosis present

## 2018-06-17 DIAGNOSIS — Z79899 Other long term (current) drug therapy: Secondary | ICD-10-CM

## 2018-06-17 DIAGNOSIS — E860 Dehydration: Secondary | ICD-10-CM | POA: Diagnosis present

## 2018-06-17 DIAGNOSIS — L03315 Cellulitis of perineum: Principal | ICD-10-CM

## 2018-06-17 DIAGNOSIS — L03319 Cellulitis of trunk, unspecified: Secondary | ICD-10-CM | POA: Diagnosis present

## 2018-06-17 DIAGNOSIS — I10 Essential (primary) hypertension: Secondary | ICD-10-CM | POA: Diagnosis present

## 2018-06-17 HISTORY — DX: Tobacco use: Z72.0

## 2018-06-17 LAB — CBG MONITORING, ED: GLUCOSE-CAPILLARY: 162 mg/dL — AB (ref 70–99)

## 2018-06-17 MED ORDER — SODIUM CHLORIDE 0.9 % IV BOLUS
1000.0000 mL | Freq: Once | INTRAVENOUS | Status: AC
  Administered 2018-06-18: 1000 mL via INTRAVENOUS

## 2018-06-17 MED ORDER — VANCOMYCIN HCL IN DEXTROSE 1-5 GM/200ML-% IV SOLN
1000.0000 mg | Freq: Once | INTRAVENOUS | Status: AC
  Administered 2018-06-18: 1000 mg via INTRAVENOUS
  Filled 2018-06-17: qty 200

## 2018-06-17 MED ORDER — LIDOCAINE-EPINEPHRINE (PF) 1 %-1:200000 IJ SOLN
20.0000 mL | Freq: Once | INTRAMUSCULAR | Status: AC
  Administered 2018-06-18: 20 mL
  Filled 2018-06-17: qty 30

## 2018-06-17 NOTE — ED Provider Notes (Signed)
[facility] EMERGENCY DEPARTMENT Provider Note   CSN: [id number] Arrival date & time: 06/17/18  2104     History   Chief Complaint Chief Complaint  Patient presents with  . Cellulitis    HPI Maria Vargas is a 52 y.o. female.  Patient with history of diabetes presenting with redness, erythema and swelling to her mons pubis for the past 3 days.  States she had a "ingrown hair" that she tried to squeeze and did not get much out of it.  She said increased redness and swelling since.  Denies fever but has had chills.  She does take metformin for her diabetes but she says this makes her sick.  She has not had any new change in vomiting.  No abdominal pain or diarrhea.  No chest pain or shortness of breath.  She was hospitalized for cellulitis of her leg last year and required 4 days of IV antibiotics.  The history is provided by the patient.    Past Medical History:  Diagnosis Date  . Anxiety   . Asthma   . Depression   . Diabetes mellitus without complication (HCC)   . Hypertension   . Thyroid disease     Patient Active Problem List   Diagnosis Date Noted  . Cellulitis 03/22/2018  . Hypothyroidism 03/22/2018  . Cellulitis and abscess of leg     Past Surgical History:  Procedure Laterality Date  . CESAREAN SECTION    . CYSTOSCOPY W/ DILATION OF BLADDER     x2  . ENDOMETRIAL ABLATION    . HYSTEROSCOPY    . TONSILLECTOMY       OB History   None      Home Medications    Prior to Admission medications   Medication Sig Start Date End Date Taking? Authorizing Provider  doxycycline (VIBRAMYCIN) 100 MG capsule Take 1 capsule (100 mg total) by mouth 2 (two) times daily. 03/29/18   Burgess AmorIdol, Julie, PA-C  Fish Oil-Cholecalciferol (FISH OIL + D3 PO) Take 1 capsule by mouth daily.    [provider]  ibuprofen (ADVIL,MOTRIN) 600 MG tablet Take 1 tablet (600 mg total) by mouth 4 (four) times daily. Patient not taking: Reported on 03/22/2018 12/02/17   Ivery QualeBryant, Hobson,  PA-C  levothyroxine (SYNTHROID, LEVOTHROID) 50 MCG tablet Take 50 mcg by mouth daily.      [provider]  megestrol (MEGACE) 40 MG tablet Take 20 mg by mouth daily.     [provider]  metFORMIN (GLUCOPHAGE) 500 MG tablet Take 1 tablet (500 mg total) by mouth 2 (two) times daily with a meal. 03/26/18 03/26/19  Kari BaarsHawkins, Edward, MD  Multiple Vitamins-Minerals (MULTIVITAMIN ADULT PO) Take 1 tablet by mouth daily.     [provider]  nicotine (NICODERM CQ - DOSED IN MG/24 HOURS) 21 mg/24hr patch Place 1 patch (21 mg total) onto the skin daily. 03/27/18   Kari BaarsHawkins, Edward, MD  sertraline (ZOLOFT) 100 MG tablet Take 200 mg by mouth daily.     [provider]  spironolactone (ALDACTONE) 25 MG tablet Take 25 mg by mouth daily.      [provider]    Family History History reviewed. No pertinent family history.  Social History Social History   Tobacco Use  . Smoking status: Current Every Day Smoker    Packs/day: 1.00    Types: Cigarettes  . Smokeless tobacco: Never Used  Substance Use Topics  . Alcohol use: Yes    Comment: rarely  .  Drug use: Never     Allergies   Clindamycin/lincomycin and Amoxicillin-pot clavulanate   Review of Systems Review of Systems  Constitutional: Positive for chills. Negative for activity change, appetite change and fever.  HENT: Negative for congestion and rhinorrhea.   Eyes: Negative for visual disturbance.  Respiratory: Negative for cough, chest tightness and shortness of breath.   Gastrointestinal: Positive for abdominal pain. Negative for nausea and vomiting.  Genitourinary: Negative for dysuria, flank pain, vaginal bleeding and vaginal discharge.  Skin: Positive for rash and wound.  Neurological: Negative for dizziness, weakness and light-headedness.    all other systems are negative except as noted in the HPI and PMH.    Physical Exam Updated Vital Signs BP (!) 165/88 (BP Location: Right Arm)    Pulse (!) 101   Temp (!) 97 F (36.1 C) (Oral)   Resp 18   Ht 4\' 10"  (1.473 m)   Wt 91.6 kg (202 lb)   SpO2 97%   BMI 42.22 kg/m   Physical Exam  Constitutional: She is oriented to person, place, and time. She appears well-developed and well-nourished. No distress.  obese  HENT:  Head: Normocephalic and atraumatic.  Mouth/Throat: Oropharynx is clear and moist. No oropharyngeal exudate.  Eyes: Pupils are equal, round, and reactive to light. Conjunctivae and EOM are normal.  Neck: Normal range of motion. Neck supple.  No meningismus.  Cardiovascular: Normal rate, regular rhythm, normal heart sounds and intact distal pulses.  No murmur heard. Pulmonary/Chest: Effort normal and breath sounds normal. No respiratory distress.  Abdominal: Soft. There is no tenderness. There is no rebound and no guarding.  Erythema and induration to mons pubis. No fluctuance. No crepitance.   Genitourinary:  Genitourinary Comments: Erythema and induration to mons pubis. No fluctuance. No crepitance. \ Chaperone present  Musculoskeletal: Normal range of motion. She exhibits no edema or tenderness.  Neurological: She is alert and oriented to person, place, and time. No cranial nerve deficit. She exhibits normal muscle tone. Coordination normal.   5/5 strength throughout. CN 2-12 intact.Equal grip strength.   Skin: Skin is warm. Capillary refill takes less than 2 seconds. No rash noted.  Psychiatric: She has a normal mood and affect. Her behavior is normal.  Nursing note and vitals reviewed.    ED Treatments / Results  Labs (all labs ordered are listed, but only abnormal results are displayed) Labs Reviewed  URINALYSIS, ROUTINE W REFLEX MICROSCOPIC - Abnormal; Notable for the following components:      Result Value   Color, Urine STRAW (*)    Specific Gravity, Urine 1.042 (*)    Hgb urine dipstick LARGE (*)    All other components within normal limits  CBC WITH DIFFERENTIAL/PLATELET - Abnormal;  Notable for the following components:   WBC 15.2 (*)    Neutro Abs 11.1 (*)    All other components within normal limits  COMPREHENSIVE METABOLIC PANEL - Abnormal; Notable for the following components:   Glucose, Bld 119 (*)    All other components within normal limits  CBG MONITORING, ED - Abnormal; Notable for the following components:   Glucose-Capillary 162 (*)    All other components within normal limits  CBG MONITORING, ED - Abnormal; Notable for the following components:   Glucose-Capillary 110 (*)    All other components within normal limits  URINE CULTURE  CULTURE, BLOOD (ROUTINE X 2)  CULTURE, BLOOD (ROUTINE X 2)  CBC WITH DIFFERENTIAL/PLATELET  I-STAT CG4 LACTIC ACID, ED  I-STAT CG4 LACTIC  ACID, ED    EKG None  Radiology Ct Pelvis W Contrast  Result Date: 06/18/2018 CLINICAL DATA:  Moans pubis cellulitis and probable abscess. EXAM: CT PELVIS WITH CONTRAST TECHNIQUE: Multidetector CT imaging of the pelvis was performed using the standard protocol following the bolus administration of intravenous contrast. CONTRAST:  ISOVUE-300 IOPAMIDOL (ISOVUE-300) INJECTION 61% COMPARISON:  None. FINDINGS: Urinary Tract:  Normal Bowel:  The visualized colon and small bowel are normal. Vascular/Lymphatic: Normal Reproductive:  Normal uterus and adnexa. Other: There is skin thickening and subcutaneous inflammation of the mons pubis without underlying abscess or fluid collection. Musculoskeletal: No suspicious bone lesions identified. IMPRESSION: Cellulitis of the moans pubis without underlying abscess or fluid collection. Electronically Signed   By: Deatra Robinson M.D.   On: 06/18/2018 01:45    Procedures .Marland KitchenIncision and Drainage Date/Time: 06/18/2018 12:11 AM Performed by: Glynn Octave, MD Authorized by: Glynn Octave, MD   Consent:    Consent obtained:  Verbal   Consent given by:  Patient   Risks discussed:  Incomplete drainage, bleeding, pain and infection   Alternatives  discussed:  No treatment Location:    Type:  Abscess   Size:  2    Location:  Anogenital   Anogenital location:  Perineum Pre-procedure details:    Skin preparation:  Betadine Anesthesia (see MAR for exact dosages):    Anesthesia method:  Local infiltration   Local anesthetic:  Lidocaine 1% WITH epi Procedure type:    Complexity:  Simple Procedure details:    Needle aspiration: no     Incision types:  Stab incision   Incision depth:  Subcutaneous   Scalpel blade:  11   Wound management:  Probed and deloculated, irrigated with saline and extensive cleaning   Drainage:  Purulent   Drainage amount:  Scant   Wound treatment:  Wound left open   Packing materials:  None Post-procedure details:    Patient tolerance of procedure:  Tolerated well, no immediate complications   (including critical care time)  Medications Ordered in ED Medications  lidocaine-EPINEPHrine (XYLOCAINE-EPINEPHrine) 1 %-1:200000 (PF) injection 20 mL (has no administration in time range)  sodium chloride 0.9 % bolus 1,000 mL (has no administration in time range)  vancomycin (VANCOCIN) IVPB 1000 mg/200 mL premix (has no administration in time range)     Initial Impression / Assessment and Plan / ED Course  I have reviewed the triage vital signs and the nursing notes.  Pertinent labs & imaging results that were available during my care of the patient were reviewed by me and considered in my medical decision making (see chart for details).     Diabetic with cellulitis and possible abscess of mons pubis. No evidence of Fournier's gangrene at this time.  I&D attempted with minimal expression of pus.  Labs show leukocytosis with hyperglycemia.  Patient given IV fluids and IV antibiotics.  CT scan does not show any evidence of deep space infection or Fournier's gangrene.  Admission versus discharge discussed with patient.  Given her rapidity of the infection she had last year, she prefers to be admitted for  IV antibiotics. Given her diabetes and the location of this area of cellulitis, this seems reasonable.  Admission discussed with Dr. Robb Matar.  Final Clinical Impressions(s) / ED Diagnoses   Final diagnoses:  Cellulitis of perineum    ED Discharge Orders    None       Erroll Wilbourne, Jeannett Senior, MD 06/18/18 [phone]

## 2018-06-17 NOTE — ED Triage Notes (Signed)
Pt c/o red, swollen, and warm to the touch area to the pubis. Pt states she is diabetic and has not checked her blood sugar.

## 2018-06-18 ENCOUNTER — Emergency Department (HOSPITAL_COMMUNITY): Payer: Self-pay

## 2018-06-18 ENCOUNTER — Other Ambulatory Visit: Payer: Self-pay

## 2018-06-18 ENCOUNTER — Encounter (HOSPITAL_COMMUNITY): Payer: Self-pay | Admitting: Internal Medicine

## 2018-06-18 DIAGNOSIS — E118 Type 2 diabetes mellitus with unspecified complications: Secondary | ICD-10-CM

## 2018-06-18 DIAGNOSIS — F418 Other specified anxiety disorders: Secondary | ICD-10-CM

## 2018-06-18 DIAGNOSIS — E039 Hypothyroidism, unspecified: Secondary | ICD-10-CM

## 2018-06-18 DIAGNOSIS — L03319 Cellulitis of trunk, unspecified: Secondary | ICD-10-CM | POA: Diagnosis present

## 2018-06-18 DIAGNOSIS — I10 Essential (primary) hypertension: Secondary | ICD-10-CM | POA: Diagnosis present

## 2018-06-18 DIAGNOSIS — E119 Type 2 diabetes mellitus without complications: Secondary | ICD-10-CM

## 2018-06-18 LAB — BASIC METABOLIC PANEL
ANION GAP: 5 (ref 5–15)
BUN: 8 mg/dL (ref 6–20)
CALCIUM: 8.6 mg/dL — AB (ref 8.9–10.3)
CO2: 25 mmol/L (ref 22–32)
CREATININE: 0.7 mg/dL (ref 0.44–1.00)
Chloride: 109 mmol/L (ref 98–111)
GLUCOSE: 113 mg/dL — AB (ref 70–99)
Potassium: 3.5 mmol/L (ref 3.5–5.1)
Sodium: 139 mmol/L (ref 135–145)

## 2018-06-18 LAB — GLUCOSE, CAPILLARY
GLUCOSE-CAPILLARY: 96 mg/dL (ref 70–99)
Glucose-Capillary: 94 mg/dL (ref 70–99)

## 2018-06-18 LAB — CBC WITH DIFFERENTIAL/PLATELET
BASOS ABS: 0 10*3/uL (ref 0.0–0.1)
BASOS PCT: 0 %
BASOS PCT: 0 %
Basophils Absolute: 0 10*3/uL (ref 0.0–0.1)
EOS PCT: 1 %
Eosinophils Absolute: 0.1 10*3/uL (ref 0.0–0.7)
Eosinophils Absolute: 0.1 10*3/uL (ref 0.0–0.7)
Eosinophils Relative: 1 %
HCT: 39.3 % (ref 36.0–46.0)
HEMATOCRIT: 42.6 % (ref 36.0–46.0)
HEMOGLOBIN: 14.3 g/dL (ref 12.0–15.0)
Hemoglobin: 12.8 g/dL (ref 12.0–15.0)
LYMPHS PCT: 20 %
Lymphocytes Relative: 23 %
Lymphs Abs: 3 10*3/uL (ref 0.7–4.0)
Lymphs Abs: 3.1 10*3/uL (ref 0.7–4.0)
MCH: 29.6 pg (ref 26.0–34.0)
MCH: 30.2 pg (ref 26.0–34.0)
MCHC: 32.6 g/dL (ref 30.0–36.0)
MCHC: 33.6 g/dL (ref 30.0–36.0)
MCV: 90.8 fL (ref 78.0–100.0)
MCV: 89.9 fL (ref 78.0–100.0)
MONO ABS: 0.7 10*3/uL (ref 0.1–1.0)
MONOS PCT: 5 %
MONOS PCT: 6 %
Monocytes Absolute: 0.9 10*3/uL (ref 0.1–1.0)
NEUTROS ABS: 11.1 10*3/uL — AB (ref 1.7–7.7)
NEUTROS PCT: 73 %
Neutro Abs: 9.1 10*3/uL — ABNORMAL HIGH (ref 1.7–7.7)
Neutrophils Relative %: 71 %
PLATELETS: 198 10*3/uL (ref 150–400)
Platelets: 229 10*3/uL (ref 150–400)
RBC: 4.33 MIL/uL (ref 3.87–5.11)
RBC: 4.74 MIL/uL (ref 3.87–5.11)
RDW: 14 % (ref 11.5–15.5)
RDW: 14.1 % (ref 11.5–15.5)
WBC: 12.9 10*3/uL — ABNORMAL HIGH (ref 4.0–10.5)
WBC: 15.2 10*3/uL — ABNORMAL HIGH (ref 4.0–10.5)

## 2018-06-18 LAB — COMPREHENSIVE METABOLIC PANEL
ALBUMIN: 3.7 g/dL (ref 3.5–5.0)
ALK PHOS: 47 U/L (ref 38–126)
ALT: 22 U/L (ref 0–44)
AST: 21 U/L (ref 15–41)
Anion gap: 8 (ref 5–15)
BILIRUBIN TOTAL: 0.6 mg/dL (ref 0.3–1.2)
BUN: 10 mg/dL (ref 6–20)
CALCIUM: 9.2 mg/dL (ref 8.9–10.3)
CO2: 25 mmol/L (ref 22–32)
Chloride: 104 mmol/L (ref 98–111)
Creatinine, Ser: 0.75 mg/dL (ref 0.44–1.00)
GFR calc Af Amer: >60 mL/min (ref >60)
GFR calc non Af Amer: >60 mL/min (ref >60)
GLUCOSE: 119 mg/dL — AB (ref 70–99)
Potassium: 3.8 mmol/L (ref 3.5–5.1)
Sodium: 137 mmol/L (ref 135–145)
TOTAL PROTEIN: 7.4 g/dL (ref 6.5–8.1)

## 2018-06-18 LAB — CBG MONITORING, ED: GLUCOSE-CAPILLARY: 110 mg/dL — AB (ref 70–99)

## 2018-06-18 LAB — URINALYSIS, ROUTINE W REFLEX MICROSCOPIC
Bacteria, UA: NONE SEEN
Bilirubin Urine: NEGATIVE
Glucose, UA: NEGATIVE mg/dL
Ketones, ur: NEGATIVE mg/dL
Leukocytes, UA: NEGATIVE
Nitrite: NEGATIVE
PROTEIN: NEGATIVE mg/dL
SPECIFIC GRAVITY, URINE: 1.042 — AB (ref 1.005–1.030)
pH: 6 (ref 5.0–8.0)

## 2018-06-18 LAB — I-STAT CG4 LACTIC ACID, ED: LACTIC ACID, VENOUS: 1.42 mmol/L (ref 0.5–1.9)

## 2018-06-18 MED ORDER — KETOROLAC TROMETHAMINE 30 MG/ML IJ SOLN
30.0000 mg | Freq: Once | INTRAMUSCULAR | Status: AC
  Administered 2018-06-18: 30 mg via INTRAVENOUS
  Filled 2018-06-18: qty 1

## 2018-06-18 MED ORDER — SPIRONOLACTONE 25 MG PO TABS: 25.0000 mg | ORAL_TABLET | Freq: Every day | ORAL | Status: DC

## 2018-06-18 MED ORDER — KETOROLAC TROMETHAMINE 30 MG/ML IJ SOLN
30.0000 mg | Freq: Four times a day (QID) | INTRAMUSCULAR | Status: AC | PRN
  Administered 2018-06-18 – 2018-06-19 (×2): 30 mg via INTRAVENOUS
  Filled 2018-06-18 (×2): qty 1

## 2018-06-18 MED ORDER — SERTRALINE HCL 50 MG PO TABS
200.0000 mg | ORAL_TABLET | Freq: Every day | ORAL | Status: DC
  Administered 2018-06-18 – 2018-06-20 (×3): 200 mg via ORAL
  Filled 2018-06-18 (×4): qty 4

## 2018-06-18 MED ORDER — HEPARIN SODIUM (PORCINE) 5000 UNIT/ML IJ SOLN
5000.0000 [IU] | Freq: Three times a day (TID) | INTRAMUSCULAR | Status: DC
  Administered 2018-06-18 – 2018-06-20 (×7): 5000 [IU] via SUBCUTANEOUS
  Filled 2018-06-18 (×7): qty 1

## 2018-06-18 MED ORDER — ACETAMINOPHEN 325 MG PO TABS
650.0000 mg | ORAL_TABLET | Freq: Once | ORAL | Status: AC
  Administered 2018-06-18: 650 mg via ORAL
  Filled 2018-06-18: qty 2

## 2018-06-18 MED ORDER — SODIUM CHLORIDE 0.45 % IV SOLN
INTRAVENOUS | Status: DC
  Administered 2018-06-18 – 2018-06-20 (×5): via INTRAVENOUS

## 2018-06-18 MED ORDER — INSULIN ASPART 100 UNIT/ML ~~LOC~~ SOLN: 0.0000 [IU] | Freq: Every day | SUBCUTANEOUS | Status: DC

## 2018-06-18 MED ORDER — NICOTINE 21 MG/24HR TD PT24
21.0000 mg | MEDICATED_PATCH | Freq: Every day | TRANSDERMAL | Status: DC
  Administered 2018-06-18 – 2018-06-20 (×3): 21 mg via TRANSDERMAL
  Filled 2018-06-18 (×3): qty 1

## 2018-06-18 MED ORDER — PIPERACILLIN-TAZOBACTAM 3.375 G IVPB
3.3750 g | Freq: Three times a day (TID) | INTRAVENOUS | Status: DC
  Administered 2018-06-18 – 2018-06-20 (×7): 3.375 g via INTRAVENOUS
  Filled 2018-06-18 (×7): qty 50

## 2018-06-18 MED ORDER — IOPAMIDOL (ISOVUE-300) INJECTION 61%
100.0000 mL | Freq: Once | INTRAVENOUS | Status: AC | PRN
  Administered 2018-06-18: 100 mL via INTRAVENOUS

## 2018-06-18 MED ORDER — VANCOMYCIN HCL 10 G IV SOLR
1250.0000 mg | Freq: Two times a day (BID) | INTRAVENOUS | Status: DC
  Administered 2018-06-18 – 2018-06-20 (×5): 1250 mg via INTRAVENOUS
  Filled 2018-06-18 (×12): qty 1250

## 2018-06-18 MED ORDER — LEVOTHYROXINE SODIUM 50 MCG PO TABS
50.0000 ug | ORAL_TABLET | Freq: Every day | ORAL | Status: DC
  Administered 2018-06-18 – 2018-06-20 (×3): 50 ug via ORAL
  Filled 2018-06-18 (×3): qty 1

## 2018-06-18 MED ORDER — INSULIN ASPART 100 UNIT/ML ~~LOC~~ SOLN: 0.0000 [IU] | Freq: Three times a day (TID) | SUBCUTANEOUS | Status: DC

## 2018-06-18 NOTE — H&P (Signed)
History and Physical    CATERINE MCMEANS ZOX:[id number] DOB: 01-31-1966 DOA: 06/17/2018  PCP: Kari Baars, MD   Patient coming from: Home.  I have personally briefly reviewed patient's old medical records in [facility]  Chief Complaint: Skin infection.  HPI: ANICE WILSHIRE is a 52 y.o. female with medical history significant of satiety, asthma, depression, type 2 diabetes, hypertension, hypothyroidism, tobacco use who is coming to the emergency department with a 2-day history of progressively worse erythema, edema, calor and tenderness to palpation in her pubic area.  He complains of chills, mild malaise, but denies fever, night sweats and headache.  Denies sore throat, wheezing or hemoptysis.  No chest pain, dizziness, palpitations, diaphoresis, PND, orthopnea or pitting edema lower extremities.  Denies abdominal pain, nausea, emesis, diarrhea, constipation, melena or hematochezia.  Denies dysuria, frequency or hematuria.  No heat or cold intolerance.  No polyuria, polydipsia, polyphagia or blurred vision.  However the patient states that she has not been checking her blood glucose regularly.  ED Course: Her temperature 97 F, pulse 101, respirations 18, blood pressure 165/88 mmHg and O2 sat 97%  Blood cultures x2 were taken.  Her urinalysis had, increases specific gravity and large hemoglobinuria, but otherwise was normal.  Lactic acid was 1.42 mmol/L.  Her white count was 15.2 with 73% neutrophils and 20% lymphocytes.  Hemoglobin 14.3 g/dL and platelets 811.  CMP showed a glucose of 119 mg/dL, which was nonfasting.  All other CMP values are within normal limits.   Imaging: CT pelvis with contrast showed cellulitis of the pubis mons without underlying abscess or fluid collection.  Please see images and full radiology report for further detail.  Review of Systems: As per HPI otherwise 10 point review of systems negative.   Past Medical History:  Diagnosis Date  . Anxiety   .  Asthma   . Depression   . Diabetes mellitus without complication (HCC)   . Hypertension   . Thyroid disease     Past Surgical History:  Procedure Laterality Date  . CESAREAN SECTION    . CYSTOSCOPY W/ DILATION OF BLADDER     x2  . ENDOMETRIAL ABLATION    . HYSTEROSCOPY    . TONSILLECTOMY       reports that she has been smoking cigarettes.  She has been smoking about 1.00 pack per day. She has never used smokeless tobacco. She reports that she drinks alcohol. She reports that she does not use drugs.  Allergies  Allergen Reactions  . Clindamycin/Lincomycin Rash  . Amoxicillin-Pot Clavulanate Other (See Comments)    Bad yeast infection    Family History  Problem Relation Age of Onset  . Diabetes Mellitus II Mother   . Hypertension Mother   . Hypertension Father   . Arthritis Father   . Heart disease Paternal Grandmother   . Brain cancer Maternal Grandfather   . Parkinson's disease Maternal Uncle   . Alzheimer's disease Paternal Aunt     Prior to Admission medications   Medication Sig Start Date End Date Taking? Authorizing Provider  doxycycline (VIBRAMYCIN) 100 MG capsule Take 1 capsule (100 mg total) by mouth 2 (two) times daily. 03/29/18   Burgess Amor, PA-C  Fish Oil-Cholecalciferol (FISH OIL + D3 PO) Take 1 capsule by mouth daily.    [provider]  ibuprofen (ADVIL,MOTRIN) 600 MG tablet Take 1 tablet (600 mg total) by mouth 4 (four) times daily. Patient not taking: Reported on 03/22/2018 12/02/17   Ivery Quale,  PA-C  levothyroxine (SYNTHROID, LEVOTHROID) 50 MCG tablet Take 50 mcg by mouth daily.      [provider]  megestrol (MEGACE) 40 MG tablet Take 20 mg by mouth daily.     [provider]  metFORMIN (GLUCOPHAGE) 500 MG tablet Take 1 tablet (500 mg total) by mouth 2 (two) times daily with a meal. 03/26/18 03/26/19  Kari Baars, MD  Multiple Vitamins-Minerals (MULTIVITAMIN ADULT PO) Take 1 tablet by mouth daily.     [provider]  nicotine (NICODERM CQ - DOSED IN MG/24 HOURS) 21 mg/24hr patch Place 1 patch (21 mg total) onto the skin daily. 03/27/18   Kari Baars, MD  sertraline (ZOLOFT) 100 MG tablet Take 200 mg by mouth daily.     [provider]  spironolactone (ALDACTONE) 25 MG tablet Take 25 mg by mouth daily.      [provider]    Physical Exam: Vitals:   06/17/18 2140 06/17/18 2141  BP: (!) 165/88   Pulse: (!) 101   Resp: 18   Temp: (!) 97 F (36.1 C)   TempSrc: Oral   SpO2: 97%   Weight:  91.6 kg (202 lb)  Height:  4\' 10"  (1.473 m)    Constitutional: NAD, calm, comfortable Eyes: PERRL, lids and conjunctivae normal ENMT: Mucous membranes are moist. Posterior pharynx clear of any exudate or lesions. Neck: normal, supple, no masses, no thyromegaly Respiratory: Clear to auscultation bilaterally, no wheezing, no crackles. Normal respiratory effort. No accessory muscle use.  Cardiovascular: Regular rate and rhythm, no murmurs / rubs / gallops. No extremity edema. 2+ pedal pulses. No carotid bruits.  Abdomen: Obese, soft, no tenderness, no masses palpated. No hepatosplenomegaly. Bowel sounds positive.  Musculoskeletal: no clubbing / cyanosis. No joint deformity upper and lower extremities. Good ROM, no contractures. Normal muscle tone.  Skin: Positive wound and erythema on pubic and suprapubic area. Neurologic: CN 2-12 grossly intact. Sensation intact, DTR normal. Strength 5/5 in all 4.  Psychiatric: Normal judgment and insight. Alert and oriented x 4. Normal mood.      Labs on Admission: I have personally reviewed following labs and imaging studies  CBC: Recent Labs  Lab 06/17/18 2350  WBC 15.2*  NEUTROABS 11.1*  HGB 14.3  HCT 42.6  MCV 89.9  PLT 229   Basic Metabolic Panel: Recent Labs  Lab 06/17/18 2350  NA 137  K 3.8  CL 104  CO2 25  GLUCOSE 119*  BUN 10  CREATININE 0.75  CALCIUM 9.2   GFR: Estimated Creatinine Clearance: 82.2 mL/min (by  C-G formula based on SCr of 0.75 mg/dL). Liver Function Tests: Recent Labs  Lab 06/17/18 2350  AST 21  ALT 22  ALKPHOS 47  BILITOT 0.6  PROT 7.4  ALBUMIN 3.7   No results for input(s): LIPASE, AMYLASE in the last 168 hours. No results for input(s): AMMONIA in the last 168 hours. Coagulation Profile: No results for input(s): INR, PROTIME in the last 168 hours. Cardiac Enzymes: No results for input(s): CKTOTAL, CKMB, CKMBINDEX, TROPONINI in the last 168 hours. BNP (last 3 results) No results for input(s): PROBNP in the last 8760 hours. HbA1C: No results for input(s): HGBA1C in the last 72 hours. CBG: Recent Labs  Lab 06/17/18 2145 06/18/18 0005  GLUCAP 162* 110*   Lipid Profile: No results for input(s): CHOL, HDL, LDLCALC, TRIG, CHOLHDL, LDLDIRECT in the last 72 hours. Thyroid Function Tests: No results for input(s): TSH, T4TOTAL, FREET4, T3FREE, THYROIDAB in the last 72  hours. Anemia Panel: No results for input(s): VITAMINB12, FOLATE, FERRITIN, TIBC, IRON, RETICCTPCT in the last 72 hours. Urine analysis:    Component Value Date/Time   COLORURINE STRAW (A) 06/18/2018 0100   APPEARANCEUR CLEAR 06/18/2018 0100   LABSPEC 1.042 (H) 06/18/2018 0100   PHURINE 6.0 06/18/2018 0100   GLUCOSEU NEGATIVE 06/18/2018 0100   HGBUR LARGE (A) 06/18/2018 0100   BILIRUBINUR NEGATIVE 06/18/2018 0100   KETONESUR NEGATIVE 06/18/2018 0100   PROTEINUR NEGATIVE 06/18/2018 0100   UROBILINOGEN 1.0 12/28/2009 1109   NITRITE NEGATIVE 06/18/2018 0100   LEUKOCYTESUR NEGATIVE 06/18/2018 0100    Radiological Exams on Admission: Ct Pelvis W Contrast  Result Date: 06/18/2018 CLINICAL DATA:  Moans pubis cellulitis and probable abscess. EXAM: CT PELVIS WITH CONTRAST TECHNIQUE: Multidetector CT imaging of the pelvis was performed using the standard protocol following the bolus administration of intravenous contrast. CONTRAST:  100mL ISOVUE-300 IOPAMIDOL (ISOVUE-300) INJECTION 61% COMPARISON:  None.  FINDINGS: Urinary Tract:  Normal Bowel:  The visualized colon and small bowel are normal. Vascular/Lymphatic: Normal Reproductive:  Normal uterus and adnexa. Other: There is skin thickening and subcutaneous inflammation of the mons pubis without underlying abscess or fluid collection. Musculoskeletal: No suspicious bone lesions identified. IMPRESSION: Cellulitis of the moans pubis without underlying abscess or fluid collection. Electronically Signed   By: Deatra RobinsonKevin  Herman M.D.   On: 06/18/2018 01:45    EKG: Independently reviewed.    Assessment/Plan Principal Problem:   Cellulitis of pubic region Admit to MedSurg/inpatient. Continue local care. Analgesics as needed. Continue vancomycin per pharmacy. Follow-up wound culture and sensitivity.  Active Problems:   Hypothyroidism  Continue levothyroxine 50 mcg p.o. daily. Monitor TSH yearly or as needed.    Hypertension Continue spironolactone 25 mg p.o. daily. Consider changing to ACE inhibitor. Monitor blood pressure, renal function electrolytes.    Depression with anxiety Continue sertraline 200 mg p.o. daily.    Type 2 diabetes mellitus (HCC) Carbohydrate modified diet. Per patient, she has been having side effects from metformin. CBG monitoring with regular insulin sliding scale while in the hospital.    Tobacco use Discussed at length with the patient. Patient will seriously think about harms and benefits and make a decision. Nicotine replacement therapy ordered while in the hospital. Staff to provide tobacco cessation information.     DVT prophylaxis: Heparin SQ. Code Status: Full code. Disposition Plan:  Admit for IV antibiotic therapy for 2-3  days Consults called:  Admission status:/MedSurg/inpatient.   Bobette Moavid Manuel Tajah Noguchi MD [facility] Pager [phone]  If 7PM-7AM, please contact night-coverage [email or web address] Password [email or web address]  06/18/2018, 4:45 AM

## 2018-06-18 NOTE — Progress Notes (Signed)
[facility]  [name] is a 52 y.o. female with medical history significant of satiety, asthma, depression, type 2 diabetes, hypertension, hypothyroidism, tobacco use who is coming to the emergency department with a 2-day history of progressively worse erythema, edema and tenderness  in her pubic area. She thinks she had an ingrown hair and is found to have an abscess. I and D done in ED with a small amount of pus noted. She tells me that he had an abscess on her left thigh last year for which she is admitted.   Exam: pubic area is quite erythematous and tender. There is some blood on the dressing covering the wound.  Principal Problem:   Cellulitis and abscess of pubic region Leukocytosis  - started on Vanc- I have added Zosyn today as she is a diabetic - f/u blood cultures  Active Problems:   Hypothyroidism - Synthroid    Hypertension/ dehydration  - hold Aldactone as she appears dehydrated- U sp gravity is 1.042 and mucosa is dry - cont slow IVF    Depression with anxiety - Zoloft    Type 2 diabetes mellitus  - last A1c on 03/22/18 was 7.6- place on SSI.   Dr Adah PerlHawkin's is this patient's PCP and will take over as attending tomorrow.   Maria CantorSaima Rafe Mackowski, MD

## 2018-06-18 NOTE — ED Notes (Signed)
Breakfast tray given. °

## 2018-06-18 NOTE — Progress Notes (Addendum)
Pharmacy Antibiotic Note  Maria AnisCatherine B Dufford is a 52 y.o. female admitted on 06/17/2018 with cellulitis.  Pharmacy has been consulted for Vancomycin and zosyn dosing.  Plan: Vancomycin 1000mg  IV given in ED, continue with 1250mg  IV every 12 hours.  Goal trough 10-15 mcg/mL.  Zosyn 3.375gm IV q8h EID over 4 hours F/u cxs and clinical progress Monitor V/S,labs, and levels as indicated  Height: 4\' 10"  (147.3 cm) Weight: 202 lb (91.6 kg) IBW/kg (Calculated) : 40.9  Temp (24hrs), Avg:97 F (36.1 C), Min:97 F (36.1 C), Max:97 F (36.1 C)  Recent Labs  Lab 06/17/18 2350 06/18/18 0007 06/18/18 0751  WBC 15.2*  --  12.9*  CREATININE 0.75  --  0.70  LATICACIDVEN  --  1.42  --     Estimated Creatinine Clearance: 82.2 mL/min (by C-G formula based on SCr of 0.7 mg/dL).    Allergies  Allergen Reactions  . Clindamycin/Lincomycin Rash  . Amoxicillin-Pot Clavulanate Other (See Comments)    Bad yeast infection    Antimicrobials this admission: Vancomycin 8/7>>  Zosyn 8/7>>  Dose adjustments this admission: n/a  Microbiology results: 8/6  BCx: ngtd 8/7 UCx: pending 03/2018 MRSA PCR: negative  Thank you for allowing pharmacy to be a part of this patient's care.  Elder CyphersLorie Colt Martelle, BS Pharm D, [place] Clinical Pharmacist Pager [phone] 06/18/2018 9:03 AM

## 2018-06-19 LAB — CBC WITH DIFFERENTIAL/PLATELET
Basophils Absolute: 0 10*3/uL (ref 0.0–0.1)
Basophils Relative: 0 %
Eosinophils Absolute: 0.2 10*3/uL (ref 0.0–0.7)
Eosinophils Relative: 2 %
HEMATOCRIT: 38.7 % (ref 36.0–46.0)
Hemoglobin: 12.5 g/dL (ref 12.0–15.0)
LYMPHS PCT: 27 %
Lymphs Abs: 2.7 10*3/uL (ref 0.7–4.0)
MCH: 29.5 pg (ref 26.0–34.0)
MCHC: 32.3 g/dL (ref 30.0–36.0)
MCV: 91.3 fL (ref 78.0–100.0)
MONO ABS: 0.8 10*3/uL (ref 0.1–1.0)
MONOS PCT: 7 %
Neutro Abs: 6.5 10*3/uL (ref 1.7–7.7)
Neutrophils Relative %: 64 %
Platelets: 186 10*3/uL (ref 150–400)
RBC: 4.24 MIL/uL (ref 3.87–5.11)
RDW: 14.4 % (ref 11.5–15.5)
WBC: 10.2 10*3/uL (ref 4.0–10.5)

## 2018-06-19 LAB — URINE CULTURE

## 2018-06-19 LAB — GLUCOSE, CAPILLARY
Glucose-Capillary: 97 mg/dL (ref 70–99)
Glucose-Capillary: 111 mg/dL — ABNORMAL HIGH (ref 70–99)
Glucose-Capillary: 81 mg/dL (ref 70–99)
Glucose-Capillary: 122 mg/dL — ABNORMAL HIGH (ref 70–99)

## 2018-06-19 MED ORDER — CLOTRIMAZOLE-BETAMETHASONE 1-0.05 % EX CREA
TOPICAL_CREAM | Freq: Two times a day (BID) | CUTANEOUS | Status: DC
  Administered 2018-06-19 – 2018-06-20 (×3): via TOPICAL
  Filled 2018-06-19: qty 15

## 2018-06-19 MED ORDER — LISINOPRIL 10 MG PO TABS
20.0000 mg | ORAL_TABLET | Freq: Every day | ORAL | Status: DC
  Administered 2018-06-19 – 2018-06-20 (×2): 20 mg via ORAL
  Filled 2018-06-19 (×2): qty 2

## 2018-06-19 NOTE — Progress Notes (Signed)
Subjective: She says she feels a little bit better.  Her white blood count is come down from 15,000 now to 10,000.  She is afebrile.  She has some irritation under her breasts bilaterally  Objective: Vital signs in last 24 hours: Temp:  [98.6 F (37 C)-99.2 F (37.3 C)] 98.6 F (37 C) (08/08 0519) Pulse Rate:  [74-97] 84 (08/08 0519) Resp:  [16-20] 17 (08/08 0519) BP: (131-155)/(74-86) 149/74 (08/08 0519) SpO2:  [97 %-99 %] 97 % (08/08 0807) Weight:  [93.8 kg] 93.8 kg (08/07 1539) Weight change: 2.132 kg    Intake/Output from previous day: 08/07 0701 - 08/08 0700 In: 1517 [P.O.:360; I.V.:1095; IV Piggyback:300] Out: 800 [Urine:800]  PHYSICAL EXAM General appearance: alert, cooperative and no distress Resp: clear to auscultation bilaterally Cardio: regular rate and rhythm, S1, S2 normal, no murmur, click, rub or gallop GI: The area in her pubic area still shows some erythema and swelling.  I do not feel a definite abscess.  There is still drainage from her I&D site Extremities: extremities normal, atraumatic, no cyanosis or edema  Lab Results:  Results for orders placed or performed during the hospital encounter of 06/17/18 (from the past 48 hour(s))  POC CBG, ED     Status: Abnormal   Collection Time: 06/17/18  9:45 PM  Result Value Ref Range   Glucose-Capillary 162 (H) 70 - 99 mg/dL  CBC with Differential/Platelet     Status: Abnormal   Collection Time: 06/17/18 11:50 PM  Result Value Ref Range   WBC 15.2 (H) 4.0 - 10.5 K/uL   RBC 4.74 3.87 - 5.11 MIL/uL   Hemoglobin 14.3 12.0 - 15.0 g/dL   HCT 42.6 36.0 - 46.0 %   MCV 89.9 78.0 - 100.0 fL   MCH 30.2 26.0 - 34.0 pg   MCHC 33.6 30.0 - 36.0 g/dL   RDW 14.0 11.5 - 15.5 %   Platelets 229 150 - 400 K/uL   Neutrophils Relative % 73 %   Neutro Abs 11.1 (H) 1.7 - 7.7 K/uL   Lymphocytes Relative 20 %   Lymphs Abs 3.1 0.7 - 4.0 K/uL   Monocytes Relative 6 %   Monocytes Absolute 0.9 0.1 - 1.0 K/uL   Eosinophils Relative 1  %   Eosinophils Absolute 0.1 0.0 - 0.7 K/uL   Basophils Relative 0 %   Basophils Absolute 0.0 0.0 - 0.1 K/uL    Comment: Performed at [facility], [address], [place], [place] [address]  Comprehensive metabolic panel     Status: Abnormal   Collection Time: 06/17/18 11:50 PM  Result Value Ref Range   Sodium 137 135 - 145 mmol/L   Potassium 3.8 3.5 - 5.1 mmol/L   Chloride 104 98 - 111 mmol/L   CO2 25 22 - 32 mmol/L   Glucose, Bld 119 (H) 70 - 99 mg/dL   BUN 10 6 - 20 mg/dL   Creatinine, Ser 0.75 0.44 - 1.00 mg/dL   Calcium 9.2 8.9 - 10.3 mg/dL   Total Protein 7.4 6.5 - 8.1 g/dL   Albumin 3.7 3.5 - 5.0 g/dL   AST 21 15 - 41 U/L   ALT 22 0 - 44 U/L   Alkaline Phosphatase 47 38 - 126 U/L   Total Bilirubin 0.6 0.3 - 1.2 mg/dL   GFR calc non Af Amer >60 >60 mL/min   GFR calc Af Amer >60 >60 mL/min    Comment: (NOTE) The eGFR has been calculated using the CKD EPI equation.  This calculation has not been validated in all clinical situations. eGFR's persistently <60 mL/min signify possible Chronic Kidney Disease.    Anion gap 8 5 - 15    Comment: Performed at [facility], [address], [place], [place] [address]  Blood culture (routine x 2)     Status: None (Preliminary result)   Collection Time: 06/17/18 11:52 PM  Result Value Ref Range   Specimen Description LEFT ANTECUBITAL RN BLC DRAW    Special Requests      BOTTLES DRAWN AEROBIC AND ANAEROBIC Blood Culture adequate volume   Culture      NO GROWTH 1 DAY Performed at [facility], [address] [place], [place] [address]    Report Status PENDING   Blood culture (routine x 2)     Status: None (Preliminary result)   Collection Time: 06/17/18 11:52 PM  Result Value Ref Range   Specimen Description RIGHT ANTECUBITAL    Special Requests      BOTTLES DRAWN AEROBIC AND ANAEROBIC Blood Culture adequate volume   Culture      NO GROWTH 1 DAY Performed at [facility] [place], [place] [address]    Report  Status PENDING   CBG monitoring, ED     Status: Abnormal   Collection Time: 06/18/18 12:05 AM  Result Value Ref Range   Glucose-Capillary 110 (H) 70 - 99 mg/dL  I-Stat CG4 Lactic Acid, ED     Status: None   Collection Time: 06/18/18 12:07 AM  Result Value Ref Range   Lactic Acid, Venous 1.42 0.5 - 1.9 mmol/L  Urinalysis, Routine w reflex microscopic     Status: Abnormal   Collection Time: 06/18/18  1:00 AM  Result Value Ref Range   Color, Urine STRAW (A) YELLOW   APPearance CLEAR CLEAR   Specific Gravity, Urine 1.042 (H) 1.005 - 1.030   pH 6.0 5.0 - 8.0   Glucose, UA NEGATIVE NEGATIVE mg/dL   Hgb urine dipstick LARGE (A) NEGATIVE   Bilirubin Urine NEGATIVE NEGATIVE   Ketones, ur NEGATIVE NEGATIVE mg/dL   Protein, ur NEGATIVE NEGATIVE mg/dL   Nitrite NEGATIVE NEGATIVE   Leukocytes, UA NEGATIVE NEGATIVE   RBC / HPF 6-10 0 - 5 RBC/hpf   WBC, UA 0-5 0 - 5 WBC/hpf   Bacteria, UA NONE SEEN NONE SEEN   Squamous Epithelial / LPF 0-5 0 - 5   Mucus PRESENT     Comment: Performed at [facility], [address] [place], [place] [address]  Urine Culture     Status: Abnormal   Collection Time: 06/18/18  1:00 AM  Result Value Ref Range   Specimen Description      URINE, RANDOM Performed at [facility], [address] [place], [place] [address]    Special Requests      NONE Performed at [facility], [address], [place], [place] [address]    Culture (A)     <10,000 COLONIES/mL INSIGNIFICANT GROWTH Performed at [facility] [address] [place], [place] [address]    Report Status 06/19/2018 FINAL   CBC WITH DIFFERENTIAL     Status: Abnormal   Collection Time: 06/18/18  7:51 AM  Result Value Ref Range   WBC 12.9 (H) 4.0 - 10.5 K/uL   RBC 4.33 3.87 - 5.11 MIL/uL   Hemoglobin 12.8 12.0 - 15.0 g/dL   HCT 39.3 36.0 - 46.0 %   MCV 90.8 78.0 - 100.0 fL   MCH 29.6 26.0 -  34.0 pg   MCHC 32.6 30.0 - 36.0 g/dL   RDW 14.1 11.5 - 15.5 %   Platelets 198 150 - 400 K/uL    Neutrophils Relative % 71 %   Neutro Abs 9.1 (H) 1.7 - 7.7 K/uL   Lymphocytes Relative 23 %   Lymphs Abs 3.0 0.7 - 4.0 K/uL   Monocytes Relative 5 %   Monocytes Absolute 0.7 0.1 - 1.0 K/uL   Eosinophils Relative 1 %   Eosinophils Absolute 0.1 0.0 - 0.7 K/uL   Basophils Relative 0 %   Basophils Absolute 0.0 0.0 - 0.1 K/uL    Comment: Performed at [facility], [address] [place], [place] [address]  Basic metabolic panel     Status: Abnormal   Collection Time: 06/18/18  7:51 AM  Result Value Ref Range   Sodium 139 135 - 145 mmol/L   Potassium 3.5 3.5 - 5.1 mmol/L   Chloride 109 98 - 111 mmol/L   CO2 25 22 - 32 mmol/L   Glucose, Bld 113 (H) 70 - 99 mg/dL   BUN 8 6 - 20 mg/dL   Creatinine, Ser 0.70 0.44 - 1.00 mg/dL   Calcium 8.6 (L) 8.9 - 10.3 mg/dL   GFR calc non Af Amer >60 >60 mL/min   GFR calc Af Amer >60 >60 mL/min    Comment: (NOTE) The eGFR has been calculated using the CKD EPI equation. This calculation has not been validated in all clinical situations. eGFR's persistently <60 mL/min signify possible Chronic Kidney Disease.    Anion gap 5 5 - 15    Comment: Performed at [facility], [address], [place], [place] [address]  Glucose, capillary     Status: None   Collection Time: 06/18/18  4:09 PM  Result Value Ref Range   Glucose-Capillary 94 70 - 99 mg/dL   Comment 1 Notify RN    Comment 2 Document in Chart   Glucose, capillary     Status: None   Collection Time: 06/18/18  9:11 PM  Result Value Ref Range   Glucose-Capillary 96 70 - 99 mg/dL  CBC WITH DIFFERENTIAL     Status: None   Collection Time: 06/19/18  5:13 AM  Result Value Ref Range   WBC 10.2 4.0 - 10.5 K/uL   RBC 4.24 3.87 - 5.11 MIL/uL   Hemoglobin 12.5 12.0 - 15.0 g/dL   HCT 38.7 36.0 - 46.0 %   MCV 91.3 78.0 - 100.0 fL   MCH 29.5 26.0 - 34.0 pg   MCHC 32.3 30.0 - 36.0 g/dL   RDW 14.4 11.5 - 15.5 %   Platelets 186 150 - 400 K/uL   Neutrophils Relative % 64 %   Neutro Abs 6.5 1.7 - 7.7 K/uL    Lymphocytes Relative 27 %   Lymphs Abs 2.7 0.7 - 4.0 K/uL   Monocytes Relative 7 %   Monocytes Absolute 0.8 0.1 - 1.0 K/uL   Eosinophils Relative 2 %   Eosinophils Absolute 0.2 0.0 - 0.7 K/uL   Basophils Relative 0 %   Basophils Absolute 0.0 0.0 - 0.1 K/uL    Comment: Performed at [facility], [address], [place], [place] [address]  Glucose, capillary     Status: None   Collection Time: 06/19/18  7:22 AM  Result Value Ref Range   Glucose-Capillary 97 70 - 99 mg/dL    ABGS No results for input(s): PHART, PO2ART, TCO2, HCO3 in the last 72 hours.  Invalid input(s): PCO2  CULTURES Recent Results (from the past 240 hour(s))  Blood culture (routine x 2)     Status: None (Preliminary result)   Collection Time: 06/17/18 11:52 PM  Result Value Ref Range Status   Specimen Description LEFT ANTECUBITAL RN BLC DRAW  Final   Special Requests   Final    BOTTLES DRAWN AEROBIC AND ANAEROBIC Blood Culture adequate volume   Culture   Final    NO GROWTH 1 DAY Performed at [facility], [address] [place], [place] [address]    Report Status PENDING  Incomplete  Blood culture (routine x 2)     Status: None (Preliminary result)   Collection Time: 06/17/18 11:52 PM  Result Value Ref Range Status   Specimen Description RIGHT ANTECUBITAL  Final   Special Requests   Final    BOTTLES DRAWN AEROBIC AND ANAEROBIC Blood Culture adequate volume   Culture   Final    NO GROWTH 1 DAY Performed at [facility], [address], [place], [place] [address]    Report Status PENDING  Incomplete  Urine Culture     Status: Abnormal   Collection Time: 06/18/18  1:00 AM  Result Value Ref Range Status   Specimen Description   Final    URINE, RANDOM Performed at [facility], [address], [place], [place] [address]    Special Requests   Final    NONE Performed at [facility], [address], [place], [place] [address]    Culture (A)  Final    <10,000 COLONIES/mL INSIGNIFICANT GROWTH Performed at  [facility], [place] [address], [place], [place] [address]    Report Status 06/19/2018 FINAL  Final   Studies/Results: Ct Pelvis W Contrast  Result Date: 06/18/2018 CLINICAL DATA:  Moans pubis cellulitis and probable abscess. EXAM: CT PELVIS WITH CONTRAST TECHNIQUE: Multidetector CT imaging of the pelvis was performed using the standard protocol following the bolus administration of intravenous contrast. CONTRAST:  150m ISOVUE-300 IOPAMIDOL (ISOVUE-300) INJECTION 61% COMPARISON:  None. FINDINGS: Urinary Tract:  Normal Bowel:  The visualized colon and small bowel are normal. Vascular/Lymphatic: Normal Reproductive:  Normal uterus and adnexa. Other: There is skin thickening and subcutaneous inflammation of the mons pubis without underlying abscess or fluid collection. Musculoskeletal: No suspicious bone lesions identified. IMPRESSION: Cellulitis of the moans pubis without underlying abscess or fluid collection. Electronically Signed   By: KUlyses JarredM.D.   On: 06/18/2018 01:45    Medications:  Prior to Admission:  Medications Prior to Admission  Medication Sig Dispense Refill Last Dose  . Fish Oil-Cholecalciferol (FISH OIL + D3 PO) Take 1 capsule by mouth daily.   06/17/2018 at Unknown time  . levothyroxine (SYNTHROID, LEVOTHROID) 50 MCG tablet Take 50 mcg by mouth daily.     06/17/2018 at Unknown time  . lisinopril (PRINIVIL,ZESTRIL) 20 MG tablet Take 20 mg by mouth daily.  12 06/17/2018 at Unknown time  . megestrol (MEGACE) 40 MG tablet Take 20 mg by mouth daily.    06/17/2018 at Unknown time  . metFORMIN (GLUCOPHAGE) 500 MG tablet Take 1 tablet (500 mg total) by mouth 2 (two) times daily with a meal. 60 tablet 11 Past Month at Unknown time  . Multiple Vitamins-Minerals (MULTIVITAMIN ADULT PO) Take 1 tablet by mouth daily.    06/17/2018 at Unknown time  . nicotine (NICODERM CQ - DOSED IN MG/24 HOURS) 21 mg/24hr patch Place 1 patch (21 mg total) onto the skin daily. 28 patch 0   . sertraline  (  ZOLOFT) 100 MG tablet Take 200 mg by mouth daily.    06/17/2018 at Unknown time  . spironolactone (ALDACTONE) 25 MG tablet Take 25 mg by mouth daily.     06/17/2018 at Unknown time  . doxycycline (VIBRAMYCIN) 100 MG capsule Take 1 capsule (100 mg total) by mouth 2 (two) times daily. (Patient not taking: Reported on 06/18/2018) 20 capsule 0 Completed Course at Unknown time  . ibuprofen (ADVIL,MOTRIN) 600 MG tablet Take 1 tablet (600 mg total) by mouth 4 (four) times daily. (Patient not taking: Reported on 03/22/2018) 30 tablet 0 Not Taking at Unknown time   Scheduled: . heparin  5,000 Units Subcutaneous Q8H  . insulin aspart  0-15 Units Subcutaneous TID WC  . insulin aspart  0-5 Units Subcutaneous QHS  . levothyroxine  50 mcg Oral Daily  . nicotine  21 mg Transdermal Daily  . sertraline  200 mg Oral Daily   Continuous: . sodium chloride 100 mL/hr at 06/19/18 0155  . piperacillin-tazobactam (ZOSYN)  IV Stopped (06/19/18 0548)  . vancomycin Stopped (06/18/18 2240)   [id number]  Assesment: She has cellulitis in the pubic area.  She is on antibiotics and seems to be improving.  She did not have a definitive abscess but she did have an I&D in the emergency department.  I will request consultation with general surgery.  She has diabetes which complicates her situation  She has hypertension and has been off her medication because she appeared to be dehydrated.  However her blood pressure is up and I have reordered her medication Principal Problem:   Cellulitis of pubic region Active Problems:   Hypothyroidism   Hypertension   Depression with anxiety   Type 2 diabetes mellitus ([place])    Plan: Continue treatments.  Full admission.  General surgery consult restart lisinopril    LOS: 0 days   Real Cona L 06/19/2018, 8:58 AM

## 2018-06-19 NOTE — Progress Notes (Signed)
RSA  Saw patient today. Cellulitis in suprapubic area. No abscess appreciated. IV antibiotics.   Full consult note to follow  Algis GreenhouseLindsay Aldena Worm, MD

## 2018-06-19 NOTE — Consult Note (Signed)
[facility] Consult  Reason for Consult:Suprapubic cellulitis ? Abscess  Referring Physician: Dr. Luan Pulling   Chief Complaint    Cellulitis      Maria Vargas is a 52 y.o. female.  HPI: Maria Vargas is a 52 yo with DM, HTN, Hypothyroidism who presented to the ED with worsening suprapubic cellulitis. Maria Vargas has had prior episodes like this back a few months ago involving the left inner thigh and I saw her at that time, recommending antibiotics.  Maria Vargas has a history of DM and this episode Maria Vargas had worsening erythema and edema in the area for 2  Days prior to coming to the ED.  Maria Vargas denied any fevers, chills, and the ED evaluated her and felt that there was a small abscess. The ED performed an I&D and scant purulence returned.  Maria Vargas has been admitted to the hospital for IV antibiotics. Maria Vargas reports some improvement in the erythema.  Maria Vargas denies being bit by a spider. Maria Vargas does report that Maria Vargas has shaved the area.  Past Medical History:  Diagnosis Date  . Anxiety   . Asthma   . Depression   . Diabetes mellitus without complication ([place])   . Hypertension   . Thyroid disease   . Tobacco use     Past Surgical History:  Procedure Laterality Date  . CESAREAN SECTION    . CYSTOSCOPY W/ DILATION OF BLADDER     x2  . ENDOMETRIAL ABLATION    . HYSTEROSCOPY    . TONSILLECTOMY      Family History  Problem Relation Age of Onset  . Diabetes Mellitus II Mother   . Hypertension Mother   . Hypertension Father   . Arthritis Father   . Heart disease Paternal Grandmother   . Brain cancer Maternal Grandfather   . Parkinson's disease Maternal Uncle   . Alzheimer's disease Paternal Aunt     Social History   Tobacco Use  . Smoking status: Current Every Day Smoker    Packs/day: 1.00    Types: Cigarettes  . Smokeless tobacco: Never Used  Substance Use Topics  . Alcohol use: Yes    Comment: rarely  . Drug use: Never    Medications:  I have reviewed the patient's current  medications. Prior to Admission:  Medications Prior to Admission  Medication Sig Dispense Refill Last Dose  . Fish Oil-Cholecalciferol (FISH OIL + D3 PO) Take 1 capsule by mouth daily.   06/17/2018 at Unknown time  . levothyroxine (SYNTHROID, LEVOTHROID) 50 MCG tablet Take 50 mcg by mouth daily.     06/17/2018 at Unknown time  . lisinopril (PRINIVIL,ZESTRIL) 20 MG tablet Take 20 mg by mouth daily.  12 06/17/2018 at Unknown time  . megestrol (MEGACE) 40 MG tablet Take 20 mg by mouth daily.    06/17/2018 at Unknown time  . metFORMIN (GLUCOPHAGE) 500 MG tablet Take 1 tablet (500 mg total) by mouth 2 (two) times daily with a meal. 60 tablet 11 Past Month at Unknown time  . Multiple Vitamins-Minerals (MULTIVITAMIN ADULT PO) Take 1 tablet by mouth daily.    06/17/2018 at Unknown time  . nicotine (NICODERM CQ - DOSED IN MG/24 HOURS) 21 mg/24hr patch Place 1 patch (21 mg total) onto the skin daily. 28 patch 0   . sertraline (ZOLOFT) 100 MG tablet Take 200 mg by mouth daily.    06/17/2018 at Unknown time  . spironolactone (ALDACTONE) 25 MG tablet Take 25 mg by mouth daily.     06/17/2018 at  Unknown time  . doxycycline (VIBRAMYCIN) 100 MG capsule Take 1 capsule (100 mg total) by mouth 2 (two) times daily. (Patient not taking: Reported on 06/18/2018) 20 capsule 0 Completed Course at Unknown time  . ibuprofen (ADVIL,MOTRIN) 600 MG tablet Take 1 tablet (600 mg total) by mouth 4 (four) times daily. (Patient not taking: Reported on 03/22/2018) 30 tablet 0 Not Taking at Unknown time   Scheduled: . clotrimazole-betamethasone   Topical BID  . heparin  5,000 Units Subcutaneous Q8H  . insulin aspart  0-15 Units Subcutaneous TID WC  . insulin aspart  0-5 Units Subcutaneous QHS  . levothyroxine  50 mcg Oral Daily  . lisinopril  20 mg Oral Daily  . nicotine  21 mg Transdermal Daily  . sertraline  200 mg Oral Daily   Continuous: . sodium chloride 100 mL/hr at 06/20/18 0126  . piperacillin-tazobactam (ZOSYN)  IV Stopped (06/20/18  0654)  . vancomycin Stopped (06/19/18 2254)   Allergies  Allergen Reactions  . Clindamycin/Lincomycin Rash  . Amoxicillin-Pot Clavulanate Other (See Comments)    Bad yeast infection    ROS:  A comprehensive review of systems was negative except for: Musculoskeletal: positive for suprapubic pain and swelling, redness  Blood pressure (!) 149/74, pulse 84, temperature 98.6 F (37 C), temperature source Oral, resp. rate 17, height 4' 10" (1.473 m), weight 93.8 kg, SpO2 97 %. Physical Exam  Constitutional: Maria Vargas is oriented to person, place, and time. Maria Vargas appears well-developed and well-nourished.  HENT:  Head: Normocephalic.  Eyes: Pupils are equal, round, and reactive to light.  Neck: Normal range of motion.  Cardiovascular: Normal rate and regular rhythm.  Pulmonary/Chest: Effort normal.  Abdominal: Soft. Maria Vargas exhibits no distension. There is no tenderness.  Genitourinary:  Genitourinary Comments: Suprapubic area without air, induration and erythema, area central that had been drained, no expressed purulence, no appreciated fluctuance   Musculoskeletal: Normal range of motion. Maria Vargas exhibits no edema.  Neurological: Maria Vargas is alert and oriented to person, place, and time.  Skin: Skin is warm and dry.  Psychiatric: Maria Vargas has a normal mood and affect. Her behavior is normal. Judgment and thought content normal.  Vitals reviewed.   Results: Results for orders placed or performed during the hospital encounter of 06/17/18 (from the past 48 hour(s))  POC CBG, ED     Status: Abnormal   Collection Time: 06/17/18  9:45 PM  Result Value Ref Range   Glucose-Capillary 162 (H) 70 - 99 mg/dL  CBC with Differential/Platelet     Status: Abnormal   Collection Time: 06/17/18 11:50 PM  Result Value Ref Range   WBC 15.2 (H) 4.0 - 10.5 K/uL   RBC 4.74 3.87 - 5.11 MIL/uL   Hemoglobin 14.3 12.0 - 15.0 g/dL   HCT 42.6 36.0 - 46.0 %   MCV 89.9 78.0 - 100.0 fL   MCH 30.2 26.0 - 34.0 pg   MCHC 33.6 30.0 -  36.0 g/dL   RDW 14.0 11.5 - 15.5 %   Platelets 229 150 - 400 K/uL   Neutrophils Relative % 73 %   Neutro Abs 11.1 (H) 1.7 - 7.7 K/uL   Lymphocytes Relative 20 %   Lymphs Abs 3.1 0.7 - 4.0 K/uL   Monocytes Relative 6 %   Monocytes Absolute 0.9 0.1 - 1.0 K/uL   Eosinophils Relative 1 %   Eosinophils Absolute 0.1 0.0 - 0.7 K/uL   Basophils Relative 0 %   Basophils Absolute 0.0 0.0 - 0.1 K/uL  Comment: Performed at [facility], [address], [place], [place] [address]  Comprehensive metabolic panel     Status: Abnormal   Collection Time: 06/17/18 11:50 PM  Result Value Ref Range   Sodium 137 135 - 145 mmol/L   Potassium 3.8 3.5 - 5.1 mmol/L   Chloride 104 98 - 111 mmol/L   CO2 25 22 - 32 mmol/L   Glucose, Bld 119 (H) 70 - 99 mg/dL   BUN 10 6 - 20 mg/dL   Creatinine, Ser 0.75 0.44 - 1.00 mg/dL   Calcium 9.2 8.9 - 10.3 mg/dL   Total Protein 7.4 6.5 - 8.1 g/dL   Albumin 3.7 3.5 - 5.0 g/dL   AST 21 15 - 41 U/L   ALT 22 0 - 44 U/L   Alkaline Phosphatase 47 38 - 126 U/L   Total Bilirubin 0.6 0.3 - 1.2 mg/dL   GFR calc non Af Amer >60 >60 mL/min   GFR calc Af Amer >60 >60 mL/min    Comment: (NOTE) The eGFR has been calculated using the CKD EPI equation. This calculation has not been validated in all clinical situations. eGFR's persistently <60 mL/min signify possible Chronic Kidney Disease.    Anion gap 8 5 - 15    Comment: Performed at [facility], [address] [place], [place] [address]  Blood culture (routine x 2)     Status: None (Preliminary result)   Collection Time: 06/17/18 11:52 PM  Result Value Ref Range   Specimen Description LEFT ANTECUBITAL RN BLC DRAW    Special Requests      BOTTLES DRAWN AEROBIC AND ANAEROBIC Blood Culture adequate volume   Culture      NO GROWTH 1 DAY Performed at [facility], [address] [place], [place] [address]    Report Status PENDING   Blood culture (routine x 2)     Status: None (Preliminary result)   Collection Time:  06/17/18 11:52 PM  Result Value Ref Range   Specimen Description RIGHT ANTECUBITAL    Special Requests      BOTTLES DRAWN AEROBIC AND ANAEROBIC Blood Culture adequate volume   Culture      NO GROWTH 1 DAY Performed at [facility], [address], [place], [place] [address]    Report Status PENDING   CBG monitoring, ED     Status: Abnormal   Collection Time: 06/18/18 12:05 AM  Result Value Ref Range   Glucose-Capillary 110 (H) 70 - 99 mg/dL  I-Stat CG4 Lactic Acid, ED     Status: None   Collection Time: 06/18/18 12:07 AM  Result Value Ref Range   Lactic Acid, Venous 1.42 0.5 - 1.9 mmol/L  Urinalysis, Routine w reflex microscopic     Status: Abnormal   Collection Time: 06/18/18  1:00 AM  Result Value Ref Range   Color, Urine STRAW (A) YELLOW   APPearance CLEAR CLEAR   Specific Gravity, Urine 1.042 (H) 1.005 - 1.030   pH 6.0 5.0 - 8.0   Glucose, UA NEGATIVE NEGATIVE mg/dL   Hgb urine dipstick LARGE (A) NEGATIVE   Bilirubin Urine NEGATIVE NEGATIVE   Ketones, ur NEGATIVE NEGATIVE mg/dL   Protein, ur NEGATIVE NEGATIVE mg/dL   Nitrite NEGATIVE NEGATIVE   Leukocytes, UA NEGATIVE NEGATIVE   RBC / HPF 6-10 0 - 5 RBC/hpf   WBC, UA 0-5 0 - 5 WBC/hpf   Bacteria, UA NONE SEEN NONE SEEN   Squamous Epithelial / LPF 0-5 0 - 5   Mucus  PRESENT     Comment: Performed at [facility], [address], [place], [place] [address]  Urine Culture     Status: Abnormal   Collection Time: 06/18/18  1:00 AM  Result Value Ref Range   Specimen Description      URINE, RANDOM Performed at [facility], [address], [place], [place] [address]    Special Requests      NONE Performed at [facility], [address] [place], [place] [address]    Culture (A)     <10,000 COLONIES/mL INSIGNIFICANT GROWTH Performed at [facility] [address] [place], [place] [address]    Report Status 06/19/2018 FINAL   CBC WITH DIFFERENTIAL     Status: Abnormal   Collection Time: 06/18/18  7:51 AM  Result  Value Ref Range   WBC 12.9 (H) 4.0 - 10.5 K/uL   RBC 4.33 3.87 - 5.11 MIL/uL   Hemoglobin 12.8 12.0 - 15.0 g/dL   HCT 39.3 36.0 - 46.0 %   MCV 90.8 78.0 - 100.0 fL   MCH 29.6 26.0 - 34.0 pg   MCHC 32.6 30.0 - 36.0 g/dL   RDW 14.1 11.5 - 15.5 %   Platelets 198 150 - 400 K/uL   Neutrophils Relative % 71 %   Neutro Abs 9.1 (H) 1.7 - 7.7 K/uL   Lymphocytes Relative 23 %   Lymphs Abs 3.0 0.7 - 4.0 K/uL   Monocytes Relative 5 %   Monocytes Absolute 0.7 0.1 - 1.0 K/uL   Eosinophils Relative 1 %   Eosinophils Absolute 0.1 0.0 - 0.7 K/uL   Basophils Relative 0 %   Basophils Absolute 0.0 0.0 - 0.1 K/uL    Comment: Performed at [facility], [address] [place], [place] [address]  Basic metabolic panel     Status: Abnormal   Collection Time: 06/18/18  7:51 AM  Result Value Ref Range   Sodium 139 135 - 145 mmol/L   Potassium 3.5 3.5 - 5.1 mmol/L   Chloride 109 98 - 111 mmol/L   CO2 25 22 - 32 mmol/L   Glucose, Bld 113 (H) 70 - 99 mg/dL   BUN 8 6 - 20 mg/dL   Creatinine, Ser 0.70 0.44 - 1.00 mg/dL   Calcium 8.6 (L) 8.9 - 10.3 mg/dL   GFR calc non Af Amer >60 >60 mL/min   GFR calc Af Amer >60 >60 mL/min    Comment: (NOTE) The eGFR has been calculated using the CKD EPI equation. This calculation has not been validated in all clinical situations. eGFR's persistently <60 mL/min signify possible Chronic Kidney Disease.    Anion gap 5 5 - 15    Comment: Performed at [facility], [address] [place], [place] [address]  Glucose, capillary     Status: None   Collection Time: 06/18/18  4:09 PM  Result Value Ref Range   Glucose-Capillary 94 70 - 99 mg/dL   Comment 1 Notify RN    Comment 2 Document in Chart   Glucose, capillary     Status: None   Collection Time: 06/18/18  9:11 PM  Result Value Ref Range   Glucose-Capillary 96 70 - 99 mg/dL  CBC WITH DIFFERENTIAL     Status: None   Collection Time: 06/19/18  5:13 AM  Result Value Ref Range   WBC 10.2 4.0 - 10.5 K/uL   RBC 4.24  3.87 - 5.11 MIL/uL   Hemoglobin 12.5 12.0 - 15.0 g/dL   HCT  38.7 36.0 - 46.0 %   MCV 91.3 78.0 - 100.0 fL   MCH 29.5 26.0 - 34.0 pg   MCHC 32.3 30.0 - 36.0 g/dL   RDW 14.4 11.5 - 15.5 %   Platelets 186 150 - 400 K/uL   Neutrophils Relative % 64 %   Neutro Abs 6.5 1.7 - 7.7 K/uL   Lymphocytes Relative 27 %   Lymphs Abs 2.7 0.7 - 4.0 K/uL   Monocytes Relative 7 %   Monocytes Absolute 0.8 0.1 - 1.0 K/uL   Eosinophils Relative 2 %   Eosinophils Absolute 0.2 0.0 - 0.7 K/uL   Basophils Relative 0 %   Basophils Absolute 0.0 0.0 - 0.1 K/uL    Comment: Performed at [facility], [address], [place], [place] [address]  Glucose, capillary     Status: None   Collection Time: 06/19/18  7:22 AM  Result Value Ref Range   Glucose-Capillary 97 70 - 99 mg/dL  Glucose, capillary     Status: Abnormal   Collection Time: 06/19/18 11:22 AM  Result Value Ref Range   Glucose-Capillary 111 (H) 70 - 99 mg/dL   Personally reviewed- appearance to have stranding and edema in the suprapubic area, no abscess   Ct Pelvis W Contrast  Result Date: 06/18/2018 CLINICAL DATA:  Moans pubis cellulitis and probable abscess. EXAM: CT PELVIS WITH CONTRAST TECHNIQUE: Multidetector CT imaging of the pelvis was performed using the standard protocol following the bolus administration of intravenous contrast. CONTRAST:  111m ISOVUE-300 IOPAMIDOL (ISOVUE-300) INJECTION 61% COMPARISON:  None. FINDINGS: Urinary Tract:  Normal Bowel:  The visualized colon and small bowel are normal. Vascular/Lymphatic: Normal Reproductive:  Normal uterus and adnexa. Other: There is skin thickening and subcutaneous inflammation of the mons pubis without underlying abscess or fluid collection. Musculoskeletal: No suspicious bone lesions identified. IMPRESSION: Cellulitis of the moans pubis without underlying abscess or fluid collection. Electronically Signed   By: KUlyses JarredM.D.   On: 06/18/2018 01:45     Assessment & Plan:  CDORRINE MONTONE is a 52y.o. female with suprapubic cellulitis in the setting of diabetes. This is improving. A small area was I&D in the ED with minimal return. I appreciate no abscess today.  -Continue IV antibiotics today, if improving can likely go home tomorrow with po  -Recommended patient to be careful with shaving/ any skin trauma as Maria Vargas is obviously sensitive to infections given her 2 most recent infections   All questions were answered to the satisfaction of the patient.   LVirl Cagey8/06/2018, 2:43 PM

## 2018-06-20 DIAGNOSIS — L03319 Cellulitis of trunk, unspecified: Secondary | ICD-10-CM

## 2018-06-20 LAB — CBC WITH DIFFERENTIAL/PLATELET
BASOS ABS: 0 10*3/uL (ref 0.0–0.1)
BASOS PCT: 0 %
EOS PCT: 3 %
Eosinophils Absolute: 0.3 10*3/uL (ref 0.0–0.7)
HCT: 37.4 % (ref 36.0–46.0)
Hemoglobin: 12.4 g/dL (ref 12.0–15.0)
LYMPHS PCT: 28 %
Lymphs Abs: 2.8 10*3/uL (ref 0.7–4.0)
MCH: 30.1 pg (ref 26.0–34.0)
MCHC: 33.2 g/dL (ref 30.0–36.0)
MCV: 90.8 fL (ref 78.0–100.0)
Monocytes Absolute: 0.7 10*3/uL (ref 0.1–1.0)
Monocytes Relative: 7 %
Neutro Abs: 6.3 10*3/uL (ref 1.7–7.7)
Neutrophils Relative %: 62 %
PLATELETS: 201 10*3/uL (ref 150–400)
RBC: 4.12 MIL/uL (ref 3.87–5.11)
RDW: 14.1 % (ref 11.5–15.5)
WBC: 10.1 10*3/uL (ref 4.0–10.5)

## 2018-06-20 LAB — VANCOMYCIN, TROUGH: Vancomycin Tr: 16 ug/mL (ref 15–20)

## 2018-06-20 LAB — GLUCOSE, CAPILLARY
GLUCOSE-CAPILLARY: 84 mg/dL (ref 70–99)
GLUCOSE-CAPILLARY: 102 mg/dL — AB (ref 70–99)

## 2018-06-20 MED ORDER — VANCOMYCIN HCL IN DEXTROSE 1-5 GM/200ML-% IV SOLN: 1000.0000 mg | Freq: Two times a day (BID) | INTRAVENOUS | Status: DC

## 2018-06-20 MED ORDER — MUPIROCIN 2 % EX OINT: TOPICAL_OINTMENT | CUTANEOUS | 0 refills | Status: DC

## 2018-06-20 MED ORDER — CLOTRIMAZOLE-BETAMETHASONE 1-0.05 % EX CREA: TOPICAL_CREAM | Freq: Two times a day (BID) | CUTANEOUS | 0 refills | Status: DC

## 2018-06-20 MED ORDER — SULFAMETHOXAZOLE-TRIMETHOPRIM 800-160 MG PO TABS: 1.0000 | ORAL_TABLET | Freq: Two times a day (BID) | ORAL | 0 refills | Status: DC

## 2018-06-20 NOTE — Progress Notes (Signed)
Pharmacy Antibiotic Note. Day #3 of vancomycin and Zosyn therapy for this [age] yof with cellulitis. Patient is currently afebrile and WBC count is WNL.  Urine culture had insignificant growth and blood cultures have shown no growth to date. Vancomycin trough of 2316mcg/mL this morning is slightly above desired therapeutic range.   Plan:  Decrease vancomycin dose to 1g IV q12h to prevent eventual accumulation Continue Zosyn 3.375g IV q8h (ext inf) Continue to monitor  pertinent labs and patient progress.    Height: 4\' 10"  (147.3 cm) Weight: 206 lb 11.2 oz (93.8 kg) IBW/kg (Calculated) : 40.9  Temp (24hrs), Avg:98.5 F (36.9 C), Min:98.3 F (36.8 C), Max:98.6 F (37 C)  Recent Labs  Lab 06/17/18 2350 06/18/18 0007 06/18/18 0751 06/19/18 0513 06/20/18 0459 06/20/18 0838  WBC 15.2*  --  12.9* 10.2 10.1  --   CREATININE 0.75  --  0.70  --   --   --   LATICACIDVEN  --  1.42  --   --   --   --   VANCOTROUGH  --   --   --   --   --  16    Estimated Creatinine Clearance: 83.4 mL/min (by C-G formula based on SCr of 0.7 mg/dL).     Antimicrobials this admission: Vancomycin 8/7>>  Zosyn 8/7>>  Dose adjustments this admission: Changed Vancomycin from 1.25g IV q12h to 1g IV q12h.  Microbiology results: 8/6  BCx: ngtd 8/7 UCx: pending 03/2018 MRSA PCR: negative  Thank you for allowing pharmacy to be a part of this patient's care.  Tama Highamara Geoffry Bannister, Pharm. D. Clinical Pharmacist 06/20/2018 12:59 PM

## 2018-06-20 NOTE — Care Management Note (Signed)
Case Management Note  Patient Details  Name: Elpidio AnisCatherine B Ildefonso MRN: [id number] Date of Birth: 11-20-1965  Subjective/Objective:    Cellulitis. From home, ind with ADL's. Has PCP, has transportation, no insurance. Unemployed.                Action/Plan: DC home today. Given MATCH to assist with cost of abx.   Expected Discharge Date:  06/20/18               Expected Discharge Plan:  Home/Self Care  In-House Referral:  NA  Discharge planning Services  CM Consult, MATCH Program  Post Acute Care Choice:  NA Choice offered to:  NA  Status of Service:  Completed, signed off  If discussed at Long Length of Stay Meetings, dates discussed:    Additional Comments:  Malcolm MetroChildress, Aliya Sol Demske, RN 06/20/2018, 12:46 PM

## 2018-06-20 NOTE — Progress Notes (Signed)
Iv removed and discharge instructions reviewed with patient.

## 2018-06-20 NOTE — Discharge Summary (Signed)
Physician Discharge Summary  Patient ID: DAO MEARNS MRN: [id number] DOB/AGE: 02/12/1966 52 y.o. Primary Care Physician:Foy Mungia, Ramon Dredge, MD Admit date: 06/17/2018 Discharge date: 06/20/2018    Discharge Diagnoses:   Principal Problem:   Cellulitis of pubic region Active Problems:   Hypothyroidism   Hypertension   Depression with anxiety   Type 2 diabetes mellitus (HCC)   Allergies as of 06/20/2018      Reactions   Clindamycin/lincomycin Rash   Amoxicillin-pot Clavulanate Other (See Comments)   Bad yeast infection      Medication List    STOP taking these medications   doxycycline 100 MG capsule Commonly known as:  VIBRAMYCIN     TAKE these medications   clotrimazole-betamethasone cream Commonly known as:  LOTRISONE Apply topically 2 (two) times daily.   FISH OIL + D3 PO Take 1 capsule by mouth daily.   ibuprofen 600 MG tablet Commonly known as:  ADVIL,MOTRIN Take 1 tablet (600 mg total) by mouth 4 (four) times daily.   levothyroxine 50 MCG tablet Commonly known as:  SYNTHROID, LEVOTHROID Take 50 mcg by mouth daily.   lisinopril 20 MG tablet Commonly known as:  PRINIVIL,ZESTRIL Take 20 mg by mouth daily.   megestrol 40 MG tablet Commonly known as:  MEGACE Take 20 mg by mouth daily.   metFORMIN 500 MG tablet Commonly known as:  GLUCOPHAGE Take 1 tablet (500 mg total) by mouth 2 (two) times daily with a meal.   MULTIVITAMIN ADULT PO Take 1 tablet by mouth daily.   mupirocin ointment 2 % Commonly known as:  BACTROBAN Use on any cuts or skin breakdown   nicotine 21 mg/24hr patch Commonly known as:  NICODERM CQ - dosed in mg/24 hours Place 1 patch (21 mg total) onto the skin daily.   sertraline 100 MG tablet Commonly known as:  ZOLOFT Take 200 mg by mouth daily.   spironolactone 25 MG tablet Commonly known as:  ALDACTONE Take 25 mg by mouth daily.   sulfamethoxazole-trimethoprim 800-160 MG tablet Commonly known as:  BACTRIM DS,SEPTRA  DS Take 1 tablet by mouth 2 (two) times daily.       Discharged Condition: Improved    Consults: General surgery, Dr. Henreitta Leber  Significant Diagnostic Studies: Ct Pelvis W Contrast  Result Date: 06/18/2018 CLINICAL DATA:  Moans pubis cellulitis and probable abscess. EXAM: CT PELVIS WITH CONTRAST TECHNIQUE: Multidetector CT imaging of the pelvis was performed using the standard protocol following the bolus administration of intravenous contrast. CONTRAST:  ISOVUE-300 IOPAMIDOL (ISOVUE-300) INJECTION 61% COMPARISON:  None. FINDINGS: Urinary Tract:  Normal Bowel:  The visualized colon and small bowel are normal. Vascular/Lymphatic: Normal Reproductive:  Normal uterus and adnexa. Other: There is skin thickening and subcutaneous inflammation of the mons pubis without underlying abscess or fluid collection. Musculoskeletal: No suspicious bone lesions identified. IMPRESSION: Cellulitis of the moans pubis without underlying abscess or fluid collection. Electronically Signed   By: Deatra Robinson M.D.   On: 06/18/2018 01:45    Lab Results: Basic Metabolic Panel: Recent Labs    06/17/18 2350 06/18/18 0751  NA 137 139  K 3.8 3.5  CL 104 109  CO2 25 25  GLUCOSE 119* 113*  BUN 10 8  CREATININE 0.75 0.70  CALCIUM 9.2 8.6*   Liver Function Tests: Recent Labs    06/17/18 2350  AST 21  ALT 22  ALKPHOS 47  BILITOT 0.6  PROT 7.4  ALBUMIN 3.7     CBC: Recent Labs    06/19/18  [id number] 06/20/18 0459  WBC 10.2 10.1  NEUTROABS 6.5 6.3  HGB 12.5 12.4  HCT 38.7 37.4  MCV 91.3 90.8  PLT 186 201    Recent Results (from the past 240 hour(s))  Blood culture (routine x 2)     Status: None (Preliminary result)   Collection Time: 06/17/18 11:52 PM  Result Value Ref Range Status   Specimen Description LEFT ANTECUBITAL RN BLC DRAW  Final   Special Requests   Final    BOTTLES DRAWN AEROBIC AND ANAEROBIC Blood Culture adequate volume   Culture   Final    NO GROWTH 2 DAYS Performed at  [facility], [address] [place], [place] [address]    Report Status PENDING  Incomplete  Blood culture (routine x 2)     Status: None (Preliminary result)   Collection Time: 06/17/18 11:52 PM  Result Value Ref Range Status   Specimen Description RIGHT ANTECUBITAL  Final   Special Requests   Final    BOTTLES DRAWN AEROBIC AND ANAEROBIC Blood Culture adequate volume   Culture   Final    NO GROWTH 2 DAYS Performed at [facility], [address] [place], [place] [address]    Report Status PENDING  Incomplete  Urine Culture     Status: Abnormal   Collection Time: 06/18/18  1:00 AM  Result Value Ref Range Status   Specimen Description   Final    URINE, RANDOM Performed at [facility], [address] [place], [place] [address]    Special Requests   Final    NONE Performed at [facility], [address] [place], [place] [address]    Culture (A)  Final    <10,000 COLONIES/mL INSIGNIFICANT GROWTH Performed at [facility], [address] [place], [place] [address]    Report Status 06/19/2018 FINAL  Final     Hospital Course: This is a 52 year old who had previous cellulitis of her left leg and who developed cellulitis in her pubic area and came to the emergency department.  She was found to have swollen erythematous pubis and had a area that was able to be drained and incised.  She had production of some mild purulent material.  She was started on IV antibiotics and improved.  She had consultation with Dr. Henreitta LeberBridges from general surgery who felt that she did not need any further drainage and that she could potentially be discharged soon.  She was much improved her white blood cell count dropped down into the normal range she had no fever her blood sugar was good throughout her hospitalization and she was ready for discharge on the ninth.  Discharge Exam: Blood pressure (!) 141/82, pulse 79, temperature 98.3 F (36.8 C), temperature source Oral, resp. rate 16, height 4\' 10"   (1.473 m), weight 93.8 kg, SpO2 99 %. She is awake and alert.  She still has some drainage from the incision site.  Chest is clear.  She is obese.  Disposition: Home she will be on oral antibiotics.  She can clean the area with peroxide soap and water.  She has a prescription for Bactroban ointment as needed if she breaks her skin down again.  She is going to use Dial soap.      Signed: Demitri Kucinski L   06/20/2018, 8:27 AM

## 2018-06-20 NOTE — Progress Notes (Signed)
Subjective: She says she feels much better.  She is been afebrile.  White blood count is still around 10,000.  She is having some drainage from the pubic area.  She says she feels substantially better still has some soreness.  Objective: Vital signs in last 24 hours: Temp:  [98.3 F (36.8 C)-98.6 F (37 C)] 98.3 F (36.8 C) (08/09 0525) Pulse Rate:  [79-88] 79 (08/09 0525) Resp:  [16-20] 16 (08/09 0525) BP: (139-152)/(79-91) 141/82 (08/09 0525) SpO2:  [95 %-99 %] 99 % (08/09 0525) Weight change:     Intake/Output from previous day: 08/08 0701 - 08/09 0700 In: 1390 [P.O.:840; IV Piggyback:550] Out: -   PHYSICAL EXAM General appearance: alert, cooperative and Obese.  No acute distress. Resp: clear to auscultation bilaterally Cardio: regular rate and rhythm, S1, S2 normal, no murmur, click, rub or gallop GI: soft, non-tender; bowel sounds normal; no masses,  no organomegaly Extremities: extremities normal, atraumatic, no cyanosis or edema Pubic area still with some erythema but looks much better.  Some drainage from the incision Lab Results:  Results for orders placed or performed during the hospital encounter of 06/17/18 (from the past 48 hour(s))  Glucose, capillary     Status: None   Collection Time: 06/18/18  4:09 PM  Result Value Ref Range   Glucose-Capillary 94 70 - 99 mg/dL   Comment 1 Notify RN    Comment 2 Document in Chart   Glucose, capillary     Status: None   Collection Time: 06/18/18  9:11 PM  Result Value Ref Range   Glucose-Capillary 96 70 - 99 mg/dL  CBC WITH DIFFERENTIAL     Status: None   Collection Time: 06/19/18  5:13 AM  Result Value Ref Range   WBC 10.2 4.0 - 10.5 K/uL   RBC 4.24 3.87 - 5.11 MIL/uL   Hemoglobin 12.5 12.0 - 15.0 g/dL   HCT 16.1 09.6 - 04.5 %   MCV 91.3 78.0 - 100.0 fL   MCH 29.5 26.0 - 34.0 pg   MCHC 32.3 30.0 - 36.0 g/dL   RDW 40.9 81.1 - 91.4 %   Platelets 186 150 - 400 K/uL   Neutrophils Relative % 64 %   Neutro Abs 6.5 1.7  - 7.7 K/uL   Lymphocytes Relative 27 %   Lymphs Abs 2.7 0.7 - 4.0 K/uL   Monocytes Relative 7 %   Monocytes Absolute 0.8 0.1 - 1.0 K/uL   Eosinophils Relative 2 %   Eosinophils Absolute 0.2 0.0 - 0.7 K/uL   Basophils Relative 0 %   Basophils Absolute 0.0 0.0 - 0.1 K/uL    Comment: Performed at [facility], [address] [place], [place] [address]  Glucose, capillary     Status: None   Collection Time: 06/19/18  7:22 AM  Result Value Ref Range   Glucose-Capillary 97 70 - 99 mg/dL  Glucose, capillary     Status: Abnormal   Collection Time: 06/19/18 11:22 AM  Result Value Ref Range   Glucose-Capillary 111 (H) 70 - 99 mg/dL  Glucose, capillary     Status: None   Collection Time: 06/19/18  4:47 PM  Result Value Ref Range   Glucose-Capillary 81 70 - 99 mg/dL  Glucose, capillary     Status: Abnormal   Collection Time: 06/19/18  9:30 PM  Result Value Ref Range   Glucose-Capillary 122 (H) 70 - 99 mg/dL  CBC WITH DIFFERENTIAL     Status: None   Collection Time: 06/20/18  4:59 AM  Result Value Ref Range   WBC 10.1 4.0 - 10.5 K/uL   RBC 4.12 3.87 - 5.11 MIL/uL   Hemoglobin 12.4 12.0 - 15.0 g/dL   HCT 16.1 09.6 - 04.5 %   MCV 90.8 78.0 - 100.0 fL   MCH 30.1 26.0 - 34.0 pg   MCHC 33.2 30.0 - 36.0 g/dL   RDW 40.9 81.1 - 91.4 %   Platelets 201 150 - 400 K/uL   Neutrophils Relative % 62 %   Neutro Abs 6.3 1.7 - 7.7 K/uL   Lymphocytes Relative 28 %   Lymphs Abs 2.8 0.7 - 4.0 K/uL   Monocytes Relative 7 %   Monocytes Absolute 0.7 0.1 - 1.0 K/uL   Eosinophils Relative 3 %   Eosinophils Absolute 0.3 0.0 - 0.7 K/uL   Basophils Relative 0 %   Basophils Absolute 0.0 0.0 - 0.1 K/uL    Comment: Performed at [facility], [address] [place], [place] [address]  Glucose, capillary     Status: None   Collection Time: 06/20/18  7:55 AM  Result Value Ref Range   Glucose-Capillary 84 70 - 99 mg/dL    ABGS No results for input(s): PHART, PO2ART, TCO2, HCO3 in the last 72 hours.  Invalid  input(s): PCO2 CULTURES Recent Results (from the past 240 hour(s))  Blood culture (routine x 2)     Status: None (Preliminary result)   Collection Time: 06/17/18 11:52 PM  Result Value Ref Range Status   Specimen Description LEFT ANTECUBITAL RN BLC DRAW  Final   Special Requests   Final    BOTTLES DRAWN AEROBIC AND ANAEROBIC Blood Culture adequate volume   Culture   Final    NO GROWTH 2 DAYS Performed at [facility], [address], [place], [place] [address]    Report Status PENDING  Incomplete  Blood culture (routine x 2)     Status: None (Preliminary result)   Collection Time: 06/17/18 11:52 PM  Result Value Ref Range Status   Specimen Description RIGHT ANTECUBITAL  Final   Special Requests   Final    BOTTLES DRAWN AEROBIC AND ANAEROBIC Blood Culture adequate volume   Culture   Final    NO GROWTH 2 DAYS Performed at [facility], [address] [place], [place], [place] [address]    Report Status PENDING  Incomplete  Urine Culture     Status: Abnormal   Collection Time: 06/18/18  1:00 AM  Result Value Ref Range Status   Specimen Description   Final    URINE, RANDOM Performed at [facility], [address] [place], [place] [address]    Special Requests   Final    NONE Performed at [facility], [address], [place], [place] [address]    Culture (A)  Final    <10,000 COLONIES/mL INSIGNIFICANT GROWTH Performed at [facility], [address] [place], [place] [address]    Report Status 06/19/2018 FINAL  Final   Studies/Results: No results found.  Medications:  Prior to Admission:  Medications Prior to Admission  Medication Sig Dispense Refill Last Dose  . Fish Oil-Cholecalciferol (FISH OIL + D3 PO) Take 1 capsule by mouth daily.   06/17/2018 at Unknown time  . levothyroxine (SYNTHROID, LEVOTHROID) 50 MCG tablet Take 50 mcg by mouth daily.     06/17/2018 at Unknown time  . lisinopril (PRINIVIL,ZESTRIL) 20 MG tablet Take 20 mg by mouth daily.  12 06/17/2018 at Unknown time   . megestrol (  MEGACE) 40 MG tablet Take 20 mg by mouth daily.    06/17/2018 at Unknown time  . metFORMIN (GLUCOPHAGE) 500 MG tablet Take 1 tablet (500 mg total) by mouth 2 (two) times daily with a meal. 60 tablet 11 Past Month at Unknown time  . Multiple Vitamins-Minerals (MULTIVITAMIN ADULT PO) Take 1 tablet by mouth daily.    06/17/2018 at Unknown time  . nicotine (NICODERM CQ - DOSED IN MG/24 HOURS) 21 mg/24hr patch Place 1 patch (21 mg total) onto the skin daily. 28 patch 0   . sertraline (ZOLOFT) 100 MG tablet Take 200 mg by mouth daily.    06/17/2018 at Unknown time  . spironolactone (ALDACTONE) 25 MG tablet Take 25 mg by mouth daily.     06/17/2018 at Unknown time  . doxycycline (VIBRAMYCIN) 100 MG capsule Take 1 capsule (100 mg total) by mouth 2 (two) times daily. (Patient not taking: Reported on 06/18/2018) 20 capsule 0 Completed Course at Unknown time  . ibuprofen (ADVIL,MOTRIN) 600 MG tablet Take 1 tablet (600 mg total) by mouth 4 (four) times daily. (Patient not taking: Reported on 03/22/2018) 30 tablet 0 Not Taking at Unknown time   Scheduled: . clotrimazole-betamethasone   Topical BID  . heparin  5,000 Units Subcutaneous Q8H  . insulin aspart  0-15 Units Subcutaneous TID WC  . insulin aspart  0-5 Units Subcutaneous QHS  . levothyroxine  50 mcg Oral Daily  . lisinopril  20 mg Oral Daily  . nicotine  21 mg Transdermal Daily  . sertraline  200 mg Oral Daily   Continuous: . sodium chloride 100 mL/hr at 06/20/18 0126  . piperacillin-tazobactam (ZOSYN)  IV Stopped (06/20/18 0654)  . vancomycin Stopped (06/19/18 2254)   PRN:  Assesment: She was admitted with cellulitis of the pubis.  She is much improved.  She has hypertension and her blood pressure is better.  She is diabetic and of course that complicates her situation.  Overall she is much better and I think she is probably okay to go home Principal Problem:   Cellulitis of pubic region Active Problems:   Hypothyroidism    Hypertension   Depression with anxiety   Type 2 diabetes mellitus (HCC)    Plan: Discharge home today    LOS: 1 day   Sherrel Shafer L 06/20/2018, 8:22 AM

## 2018-06-23 LAB — CULTURE, BLOOD (ROUTINE X 2)
CULTURE: NO GROWTH
CULTURE: NO GROWTH
SPECIAL REQUESTS: ADEQUATE
Special Requests: ADEQUATE

## 2018-07-20 ENCOUNTER — Emergency Department (HOSPITAL_COMMUNITY)
Admission: EM | Admit: 2018-07-20 | Discharge: 2018-07-21 | Disposition: A | Discharge status: Home / Self Care | Payer: Medicaid Other | Attending: Emergency Medicine | Admitting: Emergency Medicine

## 2018-07-20 ENCOUNTER — Other Ambulatory Visit: Payer: Self-pay

## 2018-07-20 ENCOUNTER — Encounter (HOSPITAL_COMMUNITY): Payer: Self-pay | Admitting: Emergency Medicine

## 2018-07-20 DIAGNOSIS — Z7984 Long term (current) use of oral hypoglycemic drugs: Secondary | ICD-10-CM | POA: Insufficient documentation

## 2018-07-20 DIAGNOSIS — I10 Essential (primary) hypertension: Secondary | ICD-10-CM | POA: Insufficient documentation

## 2018-07-20 DIAGNOSIS — J45909 Unspecified asthma, uncomplicated: Secondary | ICD-10-CM | POA: Insufficient documentation

## 2018-07-20 DIAGNOSIS — E119 Type 2 diabetes mellitus without complications: Secondary | ICD-10-CM | POA: Insufficient documentation

## 2018-07-20 DIAGNOSIS — Z79899 Other long term (current) drug therapy: Secondary | ICD-10-CM | POA: Insufficient documentation

## 2018-07-20 DIAGNOSIS — F17210 Nicotine dependence, cigarettes, uncomplicated: Secondary | ICD-10-CM | POA: Insufficient documentation

## 2018-07-20 DIAGNOSIS — L02214 Cutaneous abscess of groin: Secondary | ICD-10-CM | POA: Insufficient documentation

## 2018-07-20 DIAGNOSIS — L0291 Cutaneous abscess, unspecified: Secondary | ICD-10-CM

## 2018-07-20 DIAGNOSIS — E039 Hypothyroidism, unspecified: Secondary | ICD-10-CM | POA: Insufficient documentation

## 2018-07-20 MED ORDER — LIDOCAINE HCL (PF) 1 % IJ SOLN
5.0000 mL | Freq: Once | INTRAMUSCULAR | Status: AC
  Administered 2018-07-21: 5 mL
  Filled 2018-07-20: qty 6

## 2018-07-20 MED ORDER — POVIDONE-IODINE 10 % EX SOLN
CUTANEOUS | Status: DC | PRN
  Administered 2018-07-21: 1 via TOPICAL
  Filled 2018-07-20: qty 15

## 2018-07-20 NOTE — ED Triage Notes (Signed)
Pt with abscess to R groin area. States she has had multiple abscesses in past.

## 2018-07-21 MED ORDER — DOXYCYCLINE HYCLATE 100 MG PO TABS
100.0000 mg | ORAL_TABLET | Freq: Once | ORAL | Status: AC
  Administered 2018-07-21: 100 mg via ORAL
  Filled 2018-07-21: qty 1

## 2018-07-21 MED ORDER — DOXYCYCLINE HYCLATE 100 MG PO CAPS: 100.0000 mg | ORAL_CAPSULE | Freq: Two times a day (BID) | ORAL | 0 refills | Status: DC

## 2018-07-21 NOTE — ED Provider Notes (Signed)
[facility] EMERGENCY DEPARTMENT Provider Note   CSN: [id number] Arrival date & time: 07/20/18  2232     History   Chief Complaint Chief Complaint  Patient presents with  . Abscess    HPI Maria Vargas is a 52 y.o. female with a history significant for DM, prior abscesses and cellulitis requiring admission presenting with a small abscess in her right groin which she first noticed yesterday.  She reports moderate pain and no drainage from the site.  Given her past history of infections getting worse quickly she wants to get on antibiotics today.  She denies fevers, chills radiation of pain or other complaints. Her blood glucose levels have been well controlled.  She has employed warm compresses without relief of sx or drainage.  The history is provided by the patient.    Past Medical History:  Diagnosis Date  . Anxiety   . Asthma   . Depression   . Diabetes mellitus without complication (HCC)   . Hypertension   . Thyroid disease   . Tobacco use     Patient Active Problem List   Diagnosis Date Noted  . Cellulitis of pubic region 06/18/2018  . Hypertension 06/18/2018  . Depression with anxiety 06/18/2018  . Type 2 diabetes mellitus (HCC) 06/18/2018  . Cellulitis 03/22/2018  . Hypothyroidism 03/22/2018  . Cellulitis and abscess of leg     Past Surgical History:  Procedure Laterality Date  . CESAREAN SECTION    . CYSTOSCOPY W/ DILATION OF BLADDER     x2  . ENDOMETRIAL ABLATION    . HYSTEROSCOPY    . TONSILLECTOMY       OB History   None      Home Medications    Prior to Admission medications   Medication Sig Start Date End Date Taking? Authorizing Provider  clotrimazole-betamethasone (LOTRISONE) cream Apply topically 2 (two) times daily. 06/20/18   Kari Baars, MD  doxycycline (VIBRAMYCIN) 100 MG capsule Take 1 capsule (100 mg total) by mouth 2 (two) times daily. 07/21/18   Burgess Amor, PA-C  Fish Oil-Cholecalciferol (FISH OIL + D3 PO) Take 1 capsule by  mouth daily.    [provider]  ibuprofen (ADVIL,MOTRIN) 600 MG tablet Take 1 tablet (600 mg total) by mouth 4 (four) times daily. Patient not taking: Reported on 03/22/2018 12/02/17   Ivery Quale, PA-C  levothyroxine (SYNTHROID, LEVOTHROID) 50 MCG tablet Take 50 mcg by mouth daily.      [provider]  lisinopril (PRINIVIL,ZESTRIL) 20 MG tablet Take 20 mg by mouth daily. 05/07/18   [provider]  megestrol (MEGACE) 40 MG tablet Take 20 mg by mouth daily.     [provider]  metFORMIN (GLUCOPHAGE) 500 MG tablet Take 1 tablet (500 mg total) by mouth 2 (two) times daily with a meal. 03/26/18 03/26/19  Kari Baars, MD  Multiple Vitamins-Minerals (MULTIVITAMIN ADULT PO) Take 1 tablet by mouth daily.     [provider]  mupirocin ointment (BACTROBAN) 2 % Use on any cuts or skin breakdown 06/20/18   Kari Baars, MD  nicotine (NICODERM CQ - DOSED IN MG/24 HOURS) 21 mg/24hr patch Place 1 patch (21 mg total) onto the skin daily. 03/27/18   Kari Baars, MD  sertraline (ZOLOFT) 100 MG tablet Take 200 mg by mouth daily.     [provider]  spironolactone (ALDACTONE) 25 MG tablet Take 25 mg by mouth daily.      [provider]  sulfamethoxazole-trimethoprim (BACTRIM DS,SEPTRA DS)  800-160 MG tablet Take 1 tablet by mouth 2 (two) times daily. 06/20/18   Kari Baars, MD    Family History Family History  Problem Relation Age of Onset  . Diabetes Mellitus II Mother   . Hypertension Mother   . Hypertension Father   . Arthritis Father   . Heart disease Paternal Grandmother   . Brain cancer Maternal Grandfather   . Parkinson's disease Maternal Uncle   . Alzheimer's disease Paternal Aunt     Social History Social History   Tobacco Use  . Smoking status: Current Every Day Smoker    Packs/day: 1.00    Types: Cigarettes  . Smokeless tobacco: Never Used  Substance Use Topics  . Alcohol use: Yes    Comment: rarely  . Drug use:  Never     Allergies   Clindamycin/lincomycin and Amoxicillin-pot clavulanate   Review of Systems Review of Systems  Constitutional: Negative for chills and fever.  Respiratory: Negative for shortness of breath and wheezing.   Skin: Positive for wound.  Neurological: Negative for numbness.     Physical Exam Updated Vital Signs BP (!) 142/68 (BP Location: Left Arm)   Pulse 94   Temp 98.3 F (36.8 C) (Oral)   Resp 18   Ht 4\' 10"  (1.473 m)   Wt 89.8 kg   SpO2 98%   BMI 41.38 kg/m   Physical Exam  Constitutional: She appears well-developed and well-nourished. No distress.  HENT:  Head: Normocephalic.  Cardiovascular: Normal rate.  Pulmonary/Chest: Effort normal.  Musculoskeletal: Normal range of motion. She exhibits no edema.  Skin: Skin is warm and dry.  1 cm induration with small central intact pustule right groin. No red streaking or surrounding erythema.      ED Treatments / Results  Labs (all labs ordered are listed, but only abnormal results are displayed) Labs Reviewed - No data to display  EKG None  Radiology No results found.  Procedures Procedures (including critical care time)  INCISION AND DRAINAGE Performed by: Burgess Amor Consent: Verbal consent obtained. Risks and benefits: risks, benefits and alternatives were discussed Type: abscess  Body area: right groin  Anesthesia: local infiltration  Incision was made with a scalpel.  Local anesthetic: lidocaine 1% without epinephrine  Anesthetic total: 2 ml  Complexity: complex Blunt dissection to break up loculations  Drainage: purulent  Drainage amount: small  Packing material: none  Patient tolerance: Patient tolerated the procedure well with no immediate complications.     Medications Ordered in ED Medications  povidone-iodine (BETADINE) 10 % external solution (1 application Topical Given 07/21/18 0113)  lidocaine (PF) (XYLOCAINE) 1 % injection 5 mL (5 mLs Other Given 07/21/18  0113)  doxycycline (VIBRA-TABS) tablet 100 mg (100 mg Oral Given 07/21/18 0111)     Initial Impression / Assessment and Plan / ED Course  I have reviewed the triage vital signs and the nursing notes.  Pertinent labs & imaging results that were available during my care of the patient were reviewed by me and considered in my medical decision making (see chart for details).     Pt started on doxycycline, first dose here. Discussed warm compresses/soaks. Recheck if sx are worsening or not starting to improve with tx and after first 24 hours of abx.  No cellulitis at this time.   Final Clinical Impressions(s) / ED Diagnoses   Final diagnoses:  Abscess    ED Discharge Orders         Ordered    doxycycline (VIBRAMYCIN)  100 MG capsule  2 times daily     07/21/18 0102           Burgess Amor, PA-C 07/21/18 1610    Dione Booze, MD 07/21/18 [phone]

## 2018-07-21 NOTE — ED Notes (Signed)
Lidocaine, Betadine and laceration tray at bedside

## 2018-07-21 NOTE — Discharge Instructions (Addendum)
Apply warm compresses or sit in a warm tub of water twice daily to continue keeping this site open and draining.  Take the entire course of the antibiotics prescribed.

## 2018-07-23 IMAGING — CT CT EXTREM LOW W/ CM*L*
3 series · 12 of 33 positions shown, 14 images · IV contrast (agent unspecified)
Comparison: CT of the left thigh 03/21/2018.

CONTRAST:  100 ml 3LMF3Q-MYY IOPAMIDOL (3LMF3Q-MYY) INJECTION 61%

CLINICAL DATA: Pain and swelling along the medial aspect of the
left thigh for 4-5 days, worsening.

EXAM:
CT OF THE LOWER LEFT EXTREMITY WITH CONTRAST
TECHNIQUE: Multidetector CT imaging of the lower left extremity was performed
according to the standard protocol following intravenous contrast
administration.

[Series 6: coronal st · coronal · 0.55mm/px · 3 of 142 slices shown]
[im 29/142  bone]
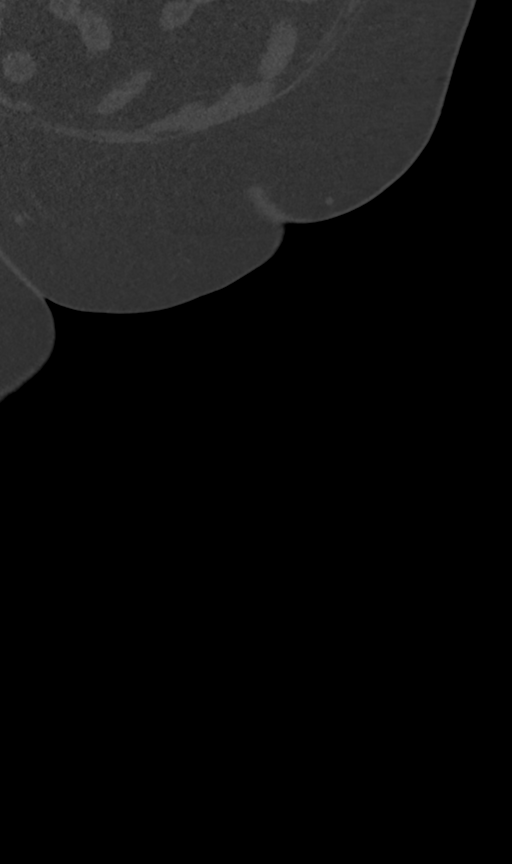
[im 57/142  bone]
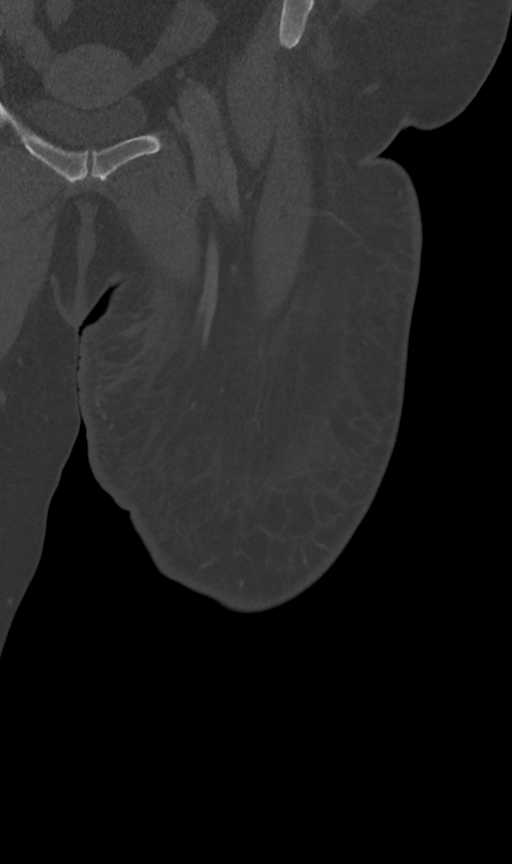
[im 85/142  bone]
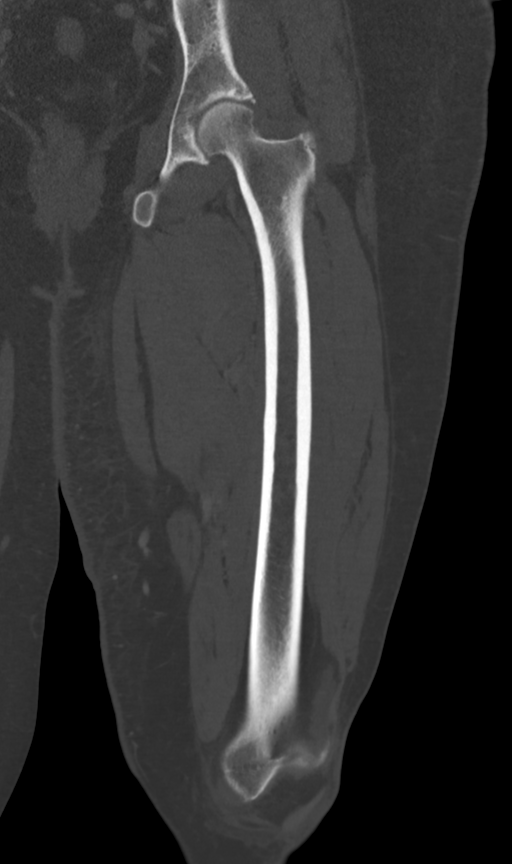

[Series 7: sagittal st · sagittal · 0.56mm/px · 5 of 106 slices shown, 6 images]
[im 36/106  bone]
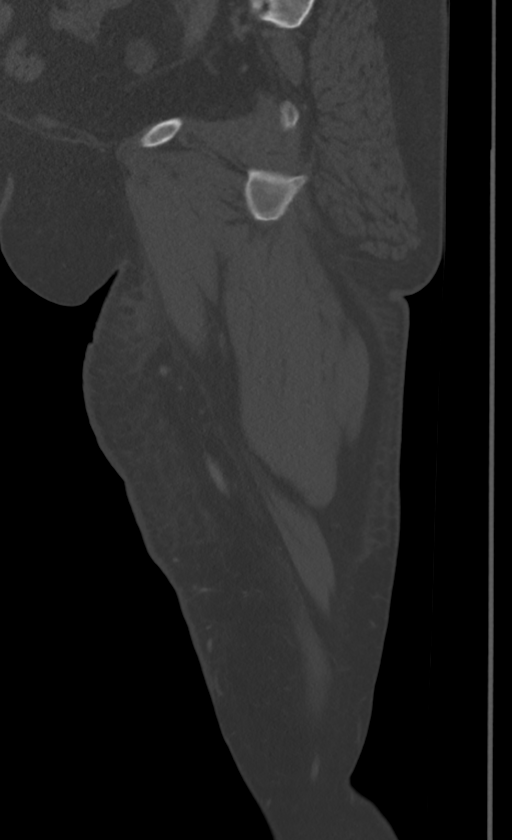
[im 44/106  bone]
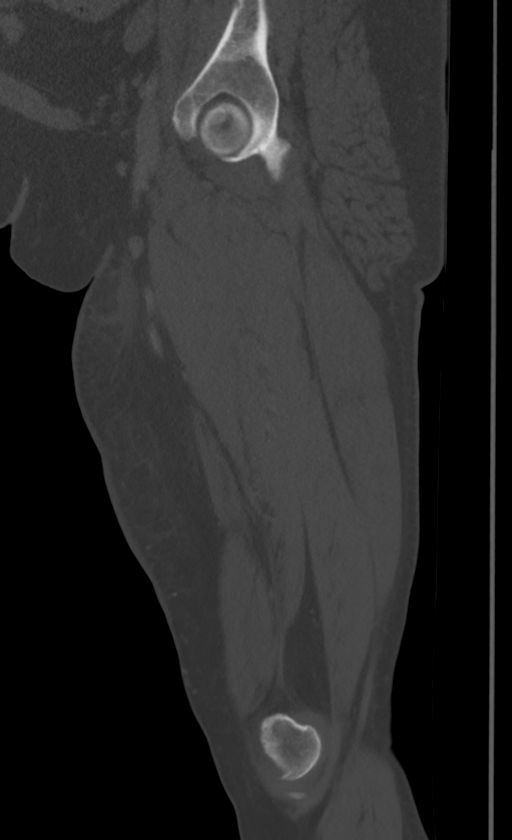
[im 53/106  soft-tissue]
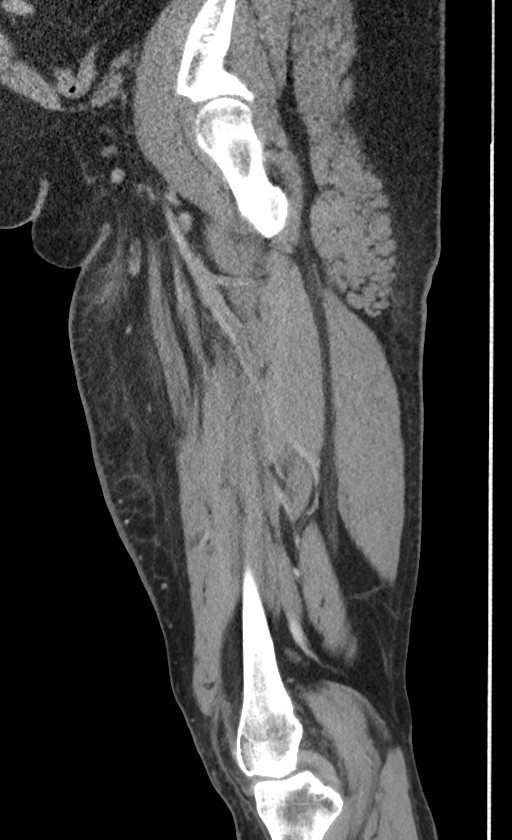
[im 53/106  bone]
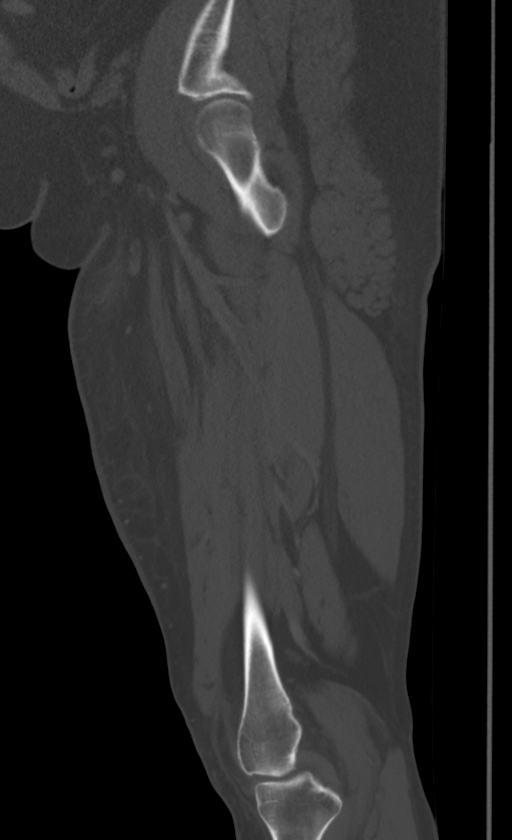
[im 62/106  bone]
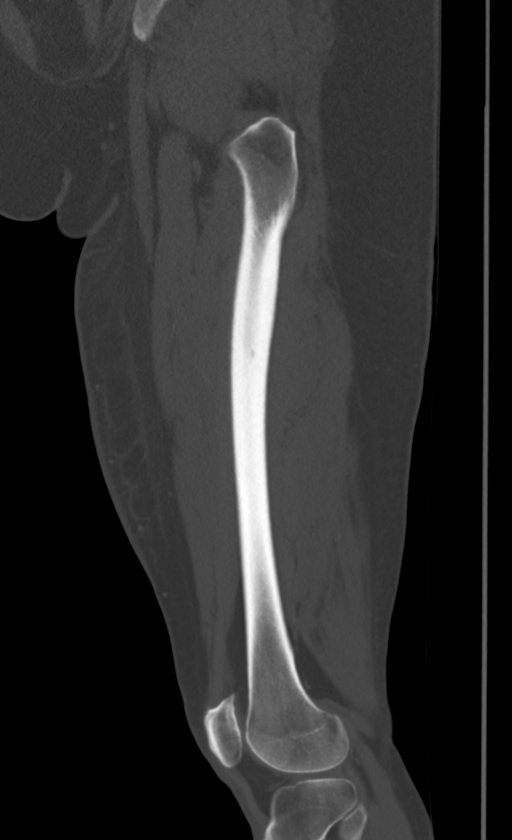
[im 71/106  bone]
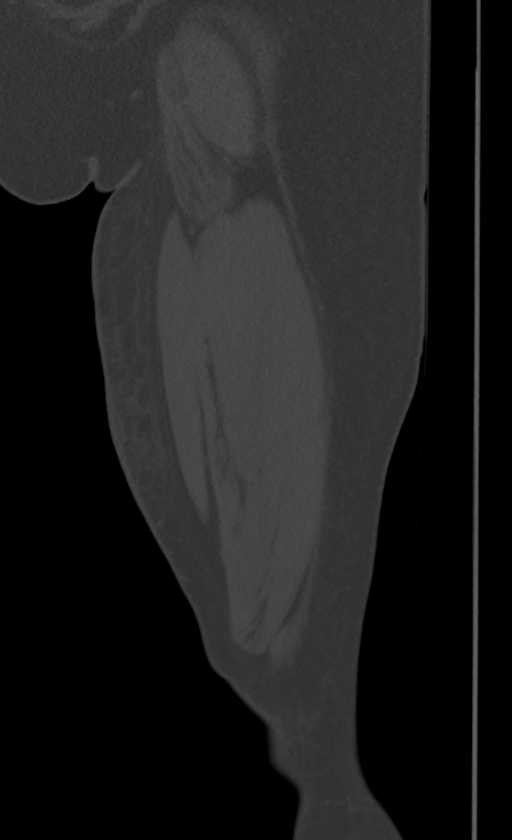

[Series 11: axial soft tissue withcontrast · axial · 0.57mm/px · z∈[-419,-89]mm · 4 of 239 slices shown, 5 images]
[im 37/239  soft-tissue]
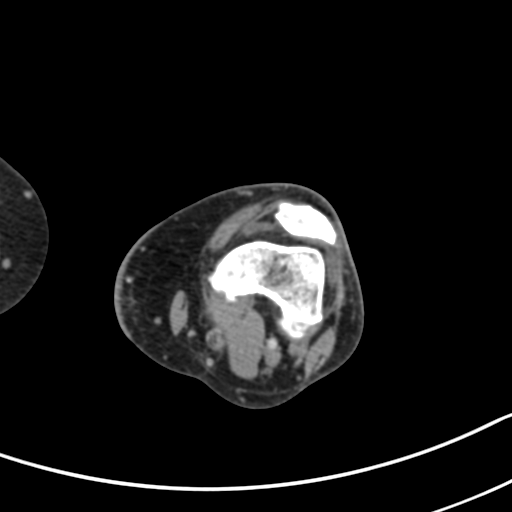
[im 37/239  bone]
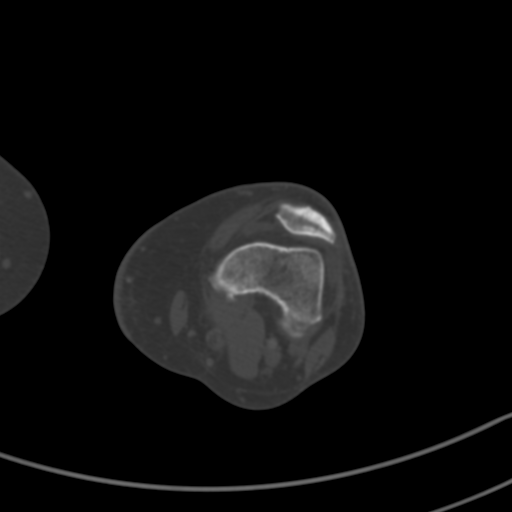
[im 92/239  bone]
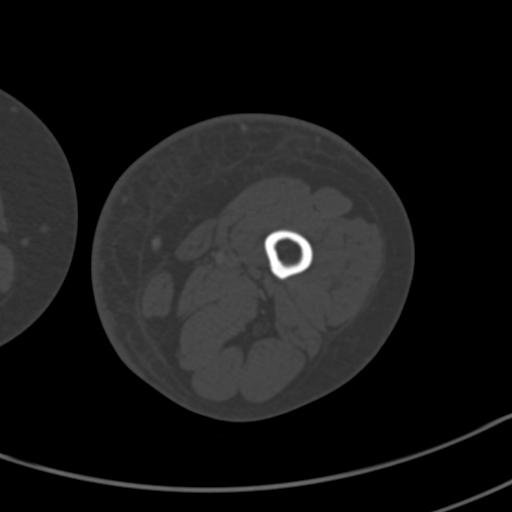
[im 147/239  bone]
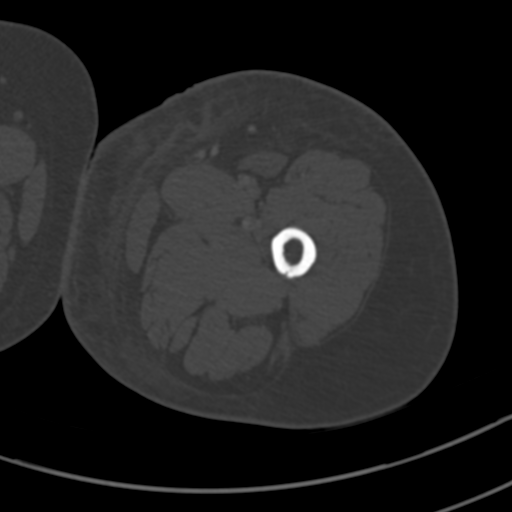
[im 202/239  bone]
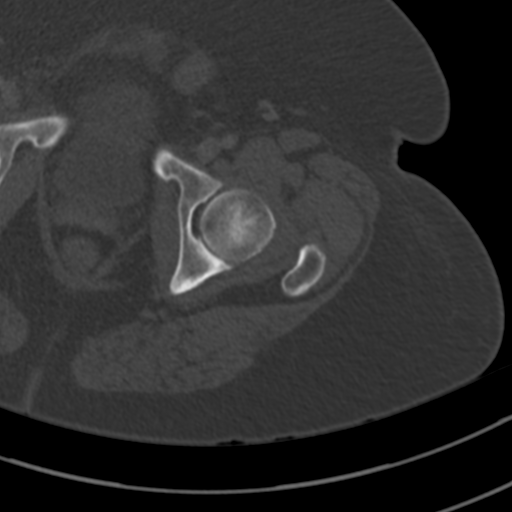

[12 of 33 positions shown; findings below may reference images not displayed]

FINDINGS: Bones/Joint/Cartilage

No bony destructive change or periosteal reaction. No focal lesion.
No avascular necrosis of the femoral head, fracture or evidence of
stress change. There is some degenerative disease about the knee.

Ligaments

Suboptimally assessed by CT.

Muscles and Tendons

Normal without evidence of inflammatory change. No intramuscular
abscess. No tear.

Soft tissues

Stranding of subcutaneous fat centered medial aspect the left thigh
has worsened with new stranding identified in subcutaneous fat
anteriorly and posteriorly. No focal fluid collection is identified.
Thickening of cutaneous tissues is most notable medially.
IMPRESSION: Findings consistent with worsened cellulitis of the left upper leg.
Negative for abscess or osteomyelitis

## 2018-11-19 ENCOUNTER — Encounter (HOSPITAL_COMMUNITY): Payer: Self-pay | Admitting: *Deleted

## 2018-11-19 ENCOUNTER — Emergency Department (HOSPITAL_COMMUNITY): Payer: Self-pay

## 2018-11-19 ENCOUNTER — Other Ambulatory Visit: Payer: Self-pay

## 2018-11-19 ENCOUNTER — Observation Stay (HOSPITAL_COMMUNITY)
Admission: EM | Admit: 2018-11-19 | Discharge: 2018-11-20 | Disposition: A | Discharge status: Home / Self Care | Payer: Self-pay | Attending: Internal Medicine | Admitting: Internal Medicine

## 2018-11-19 DIAGNOSIS — E119 Type 2 diabetes mellitus without complications: Secondary | ICD-10-CM

## 2018-11-19 DIAGNOSIS — F418 Other specified anxiety disorders: Secondary | ICD-10-CM | POA: Diagnosis present

## 2018-11-19 DIAGNOSIS — I10 Essential (primary) hypertension: Secondary | ICD-10-CM | POA: Diagnosis present

## 2018-11-19 DIAGNOSIS — L02211 Cutaneous abscess of abdominal wall: Secondary | ICD-10-CM

## 2018-11-19 DIAGNOSIS — L03311 Cellulitis of abdominal wall: Principal | ICD-10-CM | POA: Diagnosis present

## 2018-11-19 DIAGNOSIS — Z79899 Other long term (current) drug therapy: Secondary | ICD-10-CM | POA: Insufficient documentation

## 2018-11-19 DIAGNOSIS — J45909 Unspecified asthma, uncomplicated: Secondary | ICD-10-CM | POA: Insufficient documentation

## 2018-11-19 DIAGNOSIS — E039 Hypothyroidism, unspecified: Secondary | ICD-10-CM | POA: Diagnosis present

## 2018-11-19 DIAGNOSIS — Z79890 Hormone replacement therapy (postmenopausal): Secondary | ICD-10-CM | POA: Insufficient documentation

## 2018-11-19 DIAGNOSIS — Z7984 Long term (current) use of oral hypoglycemic drugs: Secondary | ICD-10-CM | POA: Insufficient documentation

## 2018-11-19 LAB — CBC WITH DIFFERENTIAL/PLATELET
Abs Immature Granulocytes: 0.07 10*3/uL (ref 0.00–0.07)
BASOS ABS: 0 10*3/uL (ref 0.0–0.1)
BASOS PCT: 0 %
EOS ABS: 0.2 10*3/uL (ref 0.0–0.5)
Eosinophils Relative: 1 %
HCT: 45.1 % (ref 36.0–46.0)
Hemoglobin: 15 g/dL (ref 12.0–15.0)
IMMATURE GRANULOCYTES: 0 %
Lymphocytes Relative: 7 %
Lymphs Abs: 1.3 10*3/uL (ref 0.7–4.0)
MCH: 29.8 pg (ref 26.0–34.0)
MCHC: 33.3 g/dL (ref 30.0–36.0)
MCV: 89.5 fL (ref 80.0–100.0)
Monocytes Absolute: 0.8 10*3/uL (ref 0.1–1.0)
Monocytes Relative: 5 %
NEUTROS PCT: 87 %
Neutro Abs: 15.1 10*3/uL — ABNORMAL HIGH (ref 1.7–7.7)
PLATELETS: 243 10*3/uL (ref 150–400)
RBC: 5.04 MIL/uL (ref 3.87–5.11)
RDW: 13.6 % (ref 11.5–15.5)
WBC: 17.4 10*3/uL — ABNORMAL HIGH (ref 4.0–10.5)
nRBC: 0 % (ref 0.0–0.2)

## 2018-11-19 LAB — URINALYSIS, ROUTINE W REFLEX MICROSCOPIC
BACTERIA UA: NONE SEEN
BILIRUBIN URINE: NEGATIVE
Glucose, UA: NEGATIVE mg/dL
HGB URINE DIPSTICK: NEGATIVE
Ketones, ur: NEGATIVE mg/dL
NITRITE: NEGATIVE
Protein, ur: NEGATIVE mg/dL
SPECIFIC GRAVITY, URINE: 1.025 (ref 1.005–1.030)
pH: 6 (ref 5.0–8.0)

## 2018-11-19 LAB — COMPREHENSIVE METABOLIC PANEL
ALBUMIN: 3.7 g/dL (ref 3.5–5.0)
ALK PHOS: 44 U/L (ref 38–126)
ALT: 21 U/L (ref 0–44)
AST: 21 U/L (ref 15–41)
Anion gap: 9 (ref 5–15)
BILIRUBIN TOTAL: 0.6 mg/dL (ref 0.3–1.2)
BUN: 20 mg/dL (ref 6–20)
CALCIUM: 9.1 mg/dL (ref 8.9–10.3)
CO2: 23 mmol/L (ref 22–32)
CREATININE: 0.83 mg/dL (ref 0.44–1.00)
Chloride: 105 mmol/L (ref 98–111)
GFR calc Af Amer: >60 mL/min (ref >60)
GLUCOSE: 145 mg/dL — AB (ref 70–99)
POTASSIUM: 4.1 mmol/L (ref 3.5–5.1)
Sodium: 137 mmol/L (ref 135–145)
TOTAL PROTEIN: 7.3 g/dL (ref 6.5–8.1)

## 2018-11-19 LAB — GLUCOSE, CAPILLARY
Glucose-Capillary: 102 mg/dL — ABNORMAL HIGH (ref 70–99)
Glucose-Capillary: 108 mg/dL — ABNORMAL HIGH (ref 70–99)
Glucose-Capillary: 79 mg/dL (ref 70–99)
Glucose-Capillary: 99 mg/dL (ref 70–99)

## 2018-11-19 LAB — HEMOGLOBIN A1C
HEMOGLOBIN A1C: 6.4 % — AB (ref 4.8–5.6)
Mean Plasma Glucose: 136.98 mg/dL

## 2018-11-19 LAB — I-STAT CG4 LACTIC ACID, ED: Lactic Acid, Venous: 1.19 mmol/L (ref 0.5–1.9)

## 2018-11-19 MED ORDER — SODIUM CHLORIDE 0.9 % IV SOLN
INTRAVENOUS | Status: DC | PRN
  Administered 2018-11-19: 250 mL via INTRAVENOUS

## 2018-11-19 MED ORDER — ENOXAPARIN SODIUM 40 MG/0.4ML ~~LOC~~ SOLN
40.0000 mg | SUBCUTANEOUS | Status: DC
  Administered 2018-11-19 – 2018-11-20 (×2): 40 mg via SUBCUTANEOUS
  Filled 2018-11-19 (×2): qty 0.4

## 2018-11-19 MED ORDER — ONDANSETRON HCL 4 MG/2ML IJ SOLN
4.0000 mg | Freq: Once | INTRAMUSCULAR | Status: AC
  Administered 2018-11-19: 4 mg via INTRAVENOUS
  Filled 2018-11-19: qty 2

## 2018-11-19 MED ORDER — ONDANSETRON HCL 4 MG/2ML IJ SOLN
4.0000 mg | Freq: Four times a day (QID) | INTRAMUSCULAR | Status: DC | PRN
  Filled 2018-11-19: qty 2

## 2018-11-19 MED ORDER — ONDANSETRON HCL 4 MG PO TABS: 4.0000 mg | ORAL_TABLET | Freq: Four times a day (QID) | ORAL | Status: DC | PRN

## 2018-11-19 MED ORDER — INSULIN ASPART 100 UNIT/ML ~~LOC~~ SOLN: 0.0000 [IU] | Freq: Three times a day (TID) | SUBCUTANEOUS | Status: DC

## 2018-11-19 MED ORDER — SODIUM CHLORIDE 0.9 % IV SOLN
2.0000 g | INTRAVENOUS | Status: DC
  Administered 2018-11-19 – 2018-11-20 (×2): 2 g via INTRAVENOUS
  Filled 2018-11-19: qty 2
  Filled 2018-11-19 (×2): qty 20

## 2018-11-19 MED ORDER — VANCOMYCIN HCL 10 G IV SOLR
1750.0000 mg | Freq: Once | INTRAVENOUS | Status: DC
  Filled 2018-11-19: qty 1750

## 2018-11-19 MED ORDER — SODIUM CHLORIDE 0.9 % IV SOLN: 1.0000 g | INTRAVENOUS | Status: DC

## 2018-11-19 MED ORDER — VANCOMYCIN HCL IN DEXTROSE 1-5 GM/200ML-% IV SOLN
1000.0000 mg | Freq: Once | INTRAVENOUS | Status: DC
  Filled 2018-11-19: qty 200

## 2018-11-19 MED ORDER — ACETAMINOPHEN 325 MG PO TABS
650.0000 mg | ORAL_TABLET | Freq: Four times a day (QID) | ORAL | Status: DC | PRN
  Administered 2018-11-19 (×2): 650 mg via ORAL
  Filled 2018-11-19 (×2): qty 2

## 2018-11-19 MED ORDER — LIDOCAINE HCL (PF) 2 % IJ SOLN
INTRAMUSCULAR | Status: AC
  Administered 2018-11-19: 10 mL
  Filled 2018-11-19: qty 20

## 2018-11-19 MED ORDER — ACETAMINOPHEN 650 MG RE SUPP: 650.0000 mg | Freq: Four times a day (QID) | RECTAL | Status: DC | PRN

## 2018-11-19 MED ORDER — LIDOCAINE HCL (PF) 2 % IJ SOLN
10.0000 mL | Freq: Once | INTRAMUSCULAR | Status: AC
  Administered 2018-11-19: 10 mL

## 2018-11-19 MED ORDER — SODIUM CHLORIDE 0.9 % IV BOLUS
1000.0000 mL | Freq: Once | INTRAVENOUS | Status: AC
  Administered 2018-11-19: 1000 mL via INTRAVENOUS

## 2018-11-19 MED ORDER — VANCOMYCIN HCL 10 G IV SOLR
2000.0000 mg | Freq: Once | INTRAVENOUS | Status: AC
  Administered 2018-11-19: 2000 mg via INTRAVENOUS
  Filled 2018-11-19: qty 2000

## 2018-11-19 MED ORDER — KETOROLAC TROMETHAMINE 30 MG/ML IJ SOLN: 30.0000 mg | Freq: Four times a day (QID) | INTRAMUSCULAR | Status: DC | PRN

## 2018-11-19 MED ORDER — SODIUM CHLORIDE 0.9 % IV SOLN
INTRAVENOUS | Status: DC
  Administered 2018-11-19 – 2018-11-20 (×2): via INTRAVENOUS

## 2018-11-19 MED ORDER — VANCOMYCIN HCL IN DEXTROSE 1-5 GM/200ML-% IV SOLN
1000.0000 mg | Freq: Two times a day (BID) | INTRAVENOUS | Status: DC
  Administered 2018-11-19 – 2018-11-20 (×2): 1000 mg via INTRAVENOUS
  Filled 2018-11-19 (×2): qty 200

## 2018-11-19 MED ORDER — POVIDONE-IODINE 10 % EX SOLN
CUTANEOUS | Status: AC
  Administered 2018-11-19: 06:00:00
  Filled 2018-11-19: qty 30

## 2018-11-19 NOTE — Progress Notes (Signed)
Patient admitted to the hospital earlier this morning by Dr. Robb Matarrtiz  Patient seen and examined.  She still has some pain in her right lower quadrant of her abdomen.  Overall she is feeling better.  She reports that incision and drainage was performed in the emergency room.  Her incision site does not appear to be draining at this time.  There is a large area of induration around the site with surrounding erythema.  Patient admitted to the hospital with abdominal wall cellulitis and abscess.  She apparently had an incision and drainage done in the emergency room, but does not appear to be draining any fluid at this time.  White count was elevated on admission.  She has been started on intravenous antibiotics.  Continue current treatments.  Darden RestaurantsJehanzeb Memon

## 2018-11-19 NOTE — ED Triage Notes (Signed)
Pt was seen at Urgent care earlier today for an abscess on her abdomen; pt states she was given antibiotics and tonight at home she was sitting on couch when she started having chills and felt nauseous

## 2018-11-19 NOTE — ED Notes (Signed)
I&D supplies in room for EDP

## 2018-11-19 NOTE — Progress Notes (Signed)
Pharmacy Antibiotic Note  Maria Vargas is a 53 y.o. female admitted on 11/19/2018 with cellulitis.  Pharmacy has been consulted for Vancomycin dosing.  Plan: Vancomycin 2000 mg IV x 1 dose. Vancomycin 1000 mg IV every 12 hours.  Goal trough 10-15 mcg/mL.  Ceftriaxone 2000 mg IV every 24 hours. Monitor labs, c/s, and vanco trough as indicated.  Height: 4\' 11"  (149.9 cm) Weight: 196 lb 6.9 oz (89.1 kg) IBW/kg (Calculated) : 43.2  Temp (24hrs), Avg:99.3 F (37.4 C), Min:98.4 F (36.9 C), Max:100.2 F (37.9 C)  Recent Labs  Lab 11/19/18 0330 11/19/18 0355  WBC 17.4*  --   CREATININE 0.83  --   LATICACIDVEN  --  1.19    Estimated Creatinine Clearance: 78.9 mL/min (by C-G formula based on SCr of 0.83 mg/dL).    Allergies  Allergen Reactions  . Clindamycin/Lincomycin Rash  . Amoxicillin-Pot Clavulanate Other (See Comments)    Bad yeast infection    Antimicrobials this admission: Vanco 1/8 >>  Rocephin 1/8 >>   Dose adjustments this admission: N/A  Microbiology results: 1/8 BCx: ngtd   Thank you for allowing pharmacy to be a part of this patient's care.  Tad MooreSteven C Tab Rylee 11/19/2018 8:32 AM

## 2018-11-19 NOTE — ED Provider Notes (Signed)
[facility] EMERGENCY DEPARTMENT Provider Note   CSN: [id number] Arrival date & time: 11/19/18  0204     History   Chief Complaint Chief Complaint  Patient presents with  . Emesis    HPI Maria Vargas is a 53 y.o. female.  The history is provided by the patient.  Emesis  She has history diabetes, anxiety, depression, asthma and comes in because of fever and chills.  She noted a red area on her abdomen yesterday and went to urgent care where she was given a prescription for trimethoprim-sulfamethoxazole.  Tonight, she developed chills and subjective fever and vomited once.  In the past, she has had to be admitted for sepsis related to abscesses.  She had not done any other treatment at home.  She does admit to a nonproductive cough, but blames that on smoking.  She denies any urinary symptoms.  Past Medical History:  Diagnosis Date  . Anxiety   . Asthma   . Depression   . Diabetes mellitus without complication (HCC)   . Hypertension   . Thyroid disease   . Tobacco use     Patient Active Problem List   Diagnosis Date Noted  . Cellulitis of pubic region 06/18/2018  . Hypertension 06/18/2018  . Depression with anxiety 06/18/2018  . Type 2 diabetes mellitus (HCC) 06/18/2018  . Cellulitis 03/22/2018  . Hypothyroidism 03/22/2018  . Cellulitis and abscess of leg     Past Surgical History:  Procedure Laterality Date  . CESAREAN SECTION    . CYSTOSCOPY W/ DILATION OF BLADDER     x2  . ENDOMETRIAL ABLATION    . HYSTEROSCOPY    . TONSILLECTOMY       OB History   No obstetric history on file.      Home Medications    Prior to Admission medications   Medication Sig Start Date End Date Taking? Authorizing Provider  clotrimazole-betamethasone (LOTRISONE) cream Apply topically 2 (two) times daily. 06/20/18   Kari Baars, MD  doxycycline (VIBRAMYCIN) 100 MG capsule Take 1 capsule (100 mg total) by mouth 2 (two) times daily. 07/21/18   Burgess Amor, PA-C  Fish  Oil-Cholecalciferol (FISH OIL + D3 PO) Take 1 capsule by mouth daily.    [provider]  ibuprofen (ADVIL,MOTRIN) 600 MG tablet Take 1 tablet (600 mg total) by mouth 4 (four) times daily. Patient not taking: Reported on 03/22/2018 12/02/17   Ivery Quale, PA-C  levothyroxine (SYNTHROID, LEVOTHROID) 50 MCG tablet Take 50 mcg by mouth daily.      [provider]  lisinopril (PRINIVIL,ZESTRIL) 20 MG tablet Take 20 mg by mouth daily. 05/07/18   [provider]  megestrol (MEGACE) 40 MG tablet Take 20 mg by mouth daily.     [provider]  metFORMIN (GLUCOPHAGE) 500 MG tablet Take 1 tablet (500 mg total) by mouth 2 (two) times daily with a meal. 03/26/18 03/26/19  Kari Baars, MD  Multiple Vitamins-Minerals (MULTIVITAMIN ADULT PO) Take 1 tablet by mouth daily.     [provider]  mupirocin ointment (BACTROBAN) 2 % Use on any cuts or skin breakdown 06/20/18   Kari Baars, MD  nicotine (NICODERM CQ - DOSED IN MG/24 HOURS) 21 mg/24hr patch Place 1 patch (21 mg total) onto the skin daily. 03/27/18   Kari Baars, MD  sertraline (ZOLOFT) 100 MG tablet Take 200 mg by mouth daily.     [provider]  spironolactone (ALDACTONE) 25 MG tablet Take 25 mg by mouth daily.  [provider]  sulfamethoxazole-trimethoprim (BACTRIM DS,SEPTRA DS) 800-160 MG tablet Take 1 tablet by mouth 2 (two) times daily. 06/20/18   Kari BaarsHawkins, Edward, MD    Family History Family History  Problem Relation Age of Onset  . Diabetes Mellitus II Mother   . Hypertension Mother   . Hypertension Father   . Arthritis Father   . Heart disease Paternal Grandmother   . Brain cancer Maternal Grandfather   . Parkinson's disease Maternal Uncle   . Alzheimer's disease Paternal Aunt     Social History Social History   Tobacco Use  . Smoking status: Current Every Day Smoker    Packs/day: 1.00    Types: Cigarettes  . Smokeless tobacco: Never Used  Substance Use  Topics  . Alcohol use: Yes    Comment: rarely  . Drug use: Never     Allergies   Clindamycin/lincomycin and Amoxicillin-pot clavulanate   Review of Systems Review of Systems  Gastrointestinal: Positive for vomiting.  All other systems reviewed and are negative.    Physical Exam Updated Vital Signs BP 129/69 (BP Location: Right Arm)   Pulse (!) 115   Temp 100.2 F (37.9 C) (Oral)   Resp 20   Ht 4\' 11"  (1.499 m)   Wt 87.5 kg   SpO2 97%   BMI 38.98 kg/m   Physical Exam Vitals signs and nursing note reviewed.    53 year old female, resting comfortably and in no acute distress. Vital signs are significant for elevated heart rate and borderline fever. Oxygen saturation is 97%, which is normal.  She is nontoxic in appearance. Head is normocephalic and atraumatic. PERRLA, EOMI. Oropharynx is clear. Neck is nontender and supple without adenopathy or JVD. Back is nontender and there is no CVA tenderness. Lungs are clear without rales, wheezes, or rhonchi. Chest is nontender. Heart has regular rate and rhythm without murmur. Abdomen is soft, flat, nontender without masses or hepatosplenomegaly and peristalsis is normoactive.  Indurated area noted in the right lower quadrant with surrounding zone of erythema.  Induration is 2.5 cm in diameter, more intense erythema is 4 cm in diameter, and total area of erythema is 10 cm in diameter.  Line has been drawn around the area of erythema at urgent care, and it is not spreading beyond the line. Extremities have no cyanosis or edema, full range of motion is present. Skin is warm and dry without rash. Neurologic: Mental status is normal, cranial nerves are intact, there are no motor or sensory deficits.  ED Treatments / Results  Labs (all labs ordered are listed, but only abnormal results are displayed) Labs Reviewed  COMPREHENSIVE METABOLIC PANEL - Abnormal; Notable for the following components:      Result Value   Glucose, Bld 145  (*)    All other components within normal limits  CBC WITH DIFFERENTIAL/PLATELET - Abnormal; Notable for the following components:   WBC 17.4 (*)    Neutro Abs 15.1 (*)    All other components within normal limits  URINALYSIS, ROUTINE W REFLEX MICROSCOPIC - Abnormal; Notable for the following components:   Leukocytes, UA SMALL (*)    All other components within normal limits  CULTURE, BLOOD (ROUTINE X 2)  CULTURE, BLOOD (ROUTINE X 2)  I-STAT CG4 LACTIC ACID, ED  I-STAT CG4 LACTIC ACID, ED    EKG EKG Interpretation  Date/Time:  Wednesday November 19 2018 03:47:54 EST Ventricular Rate:  103 PR Interval:    QRS Duration: 88 QT Interval:  342  QTC Calculation: 448 R Axis:   -8 Text Interpretation:  Sinus tachycardia Consider right atrial enlargement Otherwise within normal limits When compared with ECG of 03/22/2018, No significant change was found Confirmed by Dione BoozeGlick, [place]unna Grindstaff ([id number]) on 11/19/2018 4:09:13 AM   Radiology Dg Chest 2 View  Result Date: 11/19/2018 CLINICAL DATA:  Initial evaluation for acute fever, cough. EXAM: CHEST - 2 VIEW COMPARISON:  Prior radiograph from 08/31/2011. FINDINGS: Transverse heart size at the upper limits of normal. Mediastinal silhouette within normal limits. Lungs normally inflated. Mild scattered bronchitic changes. No consolidative opacity. No pulmonary edema or pleural effusion. No pneumothorax. No acute osseous abnormality. IMPRESSION: Mild scattered peribronchial thickening, which could reflect sequelae of acute bronchiolitis given provided history of cough. No consolidative opacity to suggest pneumonia. Electronically Signed   By: Rise MuBenjamin  McClintock M.D.   On: 11/19/2018 04:41    Procedures Ultrasound ED Soft Tissue Date/Time: 11/19/2018 3:46 AM Performed by: Dione BoozeGlick, Mikko Lewellen, MD Authorized by: Dione BoozeGlick, Rylinn Linzy, MD   Procedure details:    Indications: localization of abscess     Transverse view:  Visualized   Longitudinal view:  Visualized   Images:  archived   Location:    Location: abdominal wall     Side:  Right Findings:     abscess present  .Marland Kitchen.Incision and Drainage Date/Time: 11/19/2018 4:23 AM Performed by: Dione BoozeGlick, Polly Barner, MD Authorized by: Dione BoozeGlick, Jaymie Mckiddy, MD   Consent:    Consent obtained:  Verbal   Consent given by:  Patient   Risks discussed:  Bleeding, incomplete drainage and pain   Alternatives discussed:  Alternative treatment Location:    Type:  Abscess   Size:  2.5 cm   Location:  Trunk   Trunk location:  Abdomen Pre-procedure details:    Skin preparation:  Antiseptic wash Anesthesia (see MAR for exact dosages):    Anesthesia method:  Local infiltration   Local anesthetic:  Lidocaine 2% w/o epi Procedure type:    Complexity:  Complex Procedure details:    Needle aspiration: no     Incision types:  Cruciate   Incision depth:  Subcutaneous   Scalpel blade:  11   Wound management:  Probed and deloculated   Drainage:  Purulent   Drainage amount:  Scant   Wound treatment:  Wound left open   Packing materials:  None Post-procedure details:    Patient tolerance of procedure:  Tolerated well, no immediate complications     Medications Ordered in ED Medications  sodium chloride 0.9 % bolus 1,000 mL (has no administration in time range)  ondansetron (ZOFRAN) injection 4 mg (has no administration in time range)     Initial Impression / Assessment and Plan / ED Course  I have reviewed the triage vital signs and the nursing notes.  Pertinent labs & imaging results that were available during my care of the patient were reviewed by me and considered in my medical decision making (see chart for details).  Cellulitis of the right lower quadrant with possible early abscess formation.  Ultrasound is obtained confirming small abscess present.  Old records are reviewed confirming hospitalization for sepsis related to cellulitis and abscesses.  Currently, the area of cellulitis does not appear severe enough to account for  her fever and chills.  Sepsis evaluation is initiated including lactic acid level and will get screen for other possible sources of infection with chest x-ray and urinalysis.  Abscesses incised and drained.  Chest x-ray shows no pneumonia, urinalysis shows no evidence of infection.  ECG  shows sinus tachycardia.  Labs are significant for leukocytosis with left shift.  With her history of developing sepsis with similar presentations, decision is made to admit.  She is started on antibiotics of ceftriaxone and vancomycin.  Case is discussed with Dr. Robb Matar of [facility], who agrees to admit the patient.  Final Clinical Impressions(s) / ED Diagnoses   Final diagnoses:  Abscess of abdominal wall  Cellulitis of right abdominal wall    ED Discharge Orders    None       Dione Booze, MD 11/19/18 [phone]

## 2018-11-19 NOTE — ED Notes (Signed)
Updated on plan of care.  

## 2018-11-19 NOTE — H&P (Signed)
History and Physical    Maria Vargas PTW:[id number] DOB: 1966/06/23 DOA: 11/19/2018  PCP: Kari Baars, MD   Patient coming from: Home.  I have personally briefly reviewed patient's old medical records in [facility]  Chief Complaint: Chills, nausea and vomiting.  HPI: Maria Vargas is a 53 y.o. female with medical history significant of anxiety, asthma, depression, type 2 diabetes, hypertension, hypothyroidism, tobacco use, multiple episodes of cellulitis with occasionally associated with sepsis who is coming to the emergency department after being saying another urgent care center today earlier due to having an abscess on her abdomen for several days.  She was given Bactrim and discharged home.  While she was at home, she started developing chills and became nauseous.  Subsequently she threw up several times.  She complains of fatigue and malaise.  She had a headache 2 days ago.  She denies renal area, sore throat, wheezing or hemoptysis.  No chest pain, palpitations, diaphoresis, PND orthopnea.  Denies intra-abdominal type of pain, diarrhea, constipation, melena or hematochezia.  No dysuria, frequency or hematuria.  Denies polyuria, polydipsia, polyphagia or blurred vision.  ED Course: Initial vital signs temperature 100.2 F, pulse 115, respirations 20, blood pressure 129/69 mmHg and O2 sat 97% on room air.  The patient was given at 1000 mL NS bolus, vancomycin and ceftriaxone in the emergency department.  Dr. Preston Fleeting performed an I&D her abdominal abscess.  Urinalysis had a small leukocyte esterase positivity, but was otherwise normal.  White count was 17.4 with 87% neutrophils, hemoglobin 15.0 g/dL and platelets 700.  Her CMP was 145 mg/dL, all other values were normal.    Imaging: chest radiograph showed mild scattered peribronchial thickening, which could reflect sequelae of acute bronchiolitis given provided history of cough.  No consolidation was seen.  Please see images  and full radiology report for further detail.  Review of Systems: As per HPI otherwise 10 point review of systems negative.   Past Medical History:  Diagnosis Date  . Anxiety   . Asthma   . Depression   . Diabetes mellitus without complication (HCC)   . Hypertension   . Thyroid disease   . Tobacco use     Past Surgical History:  Procedure Laterality Date  . CESAREAN SECTION    . CYSTOSCOPY W/ DILATION OF BLADDER     x2  . ENDOMETRIAL ABLATION    . HYSTEROSCOPY    . TONSILLECTOMY       reports that she has been smoking cigarettes. She has been smoking about 1.00 pack per day. She has never used smokeless tobacco. She reports current alcohol use. She reports that she does not use drugs.  Allergies  Allergen Reactions  . Clindamycin/Lincomycin Rash  . Amoxicillin-Pot Clavulanate Other (See Comments)    Bad yeast infection    Family History  Problem Relation Age of Onset  . Diabetes Mellitus II Mother   . Hypertension Mother   . Hypertension Father   . Arthritis Father   . Heart disease Paternal Grandmother   . Brain cancer Maternal Grandfather   . Parkinson's disease Maternal Uncle   . Alzheimer's disease Paternal Aunt     Prior to Admission medications   Medication Sig Start Date End Date Taking? Authorizing Provider  Fish Oil-Cholecalciferol (FISH OIL + D3 PO) Take 1 capsule by mouth daily.   Yes [provider]  ibuprofen (ADVIL,MOTRIN) 600 MG tablet Take 1 tablet (600 mg total) by mouth 4 (four) times daily. 12/02/17  Yes Ivery QualeBryant, [place]son, PA-C  levothyroxine (SYNTHROID, LEVOTHROID) 50 MCG tablet Take 50 mcg by mouth daily.     Yes [provider]  lisinopril (PRINIVIL,ZESTRIL) 20 MG tablet Take 20 mg by mouth daily. 05/07/18  Yes [provider]  metFORMIN (GLUCOPHAGE) 500 MG tablet Take 1 tablet (500 mg total) by mouth 2 (two) times daily with a meal. 03/26/18 03/26/19 Yes Kari BaarsHawkins, Edward, MD  Multiple Vitamins-Minerals (MULTIVITAMIN ADULT  PO) Take 1 tablet by mouth daily.    Yes [provider]  sertraline (ZOLOFT) 100 MG tablet Take 200 mg by mouth daily.    Yes [provider]  sulfamethoxazole-trimethoprim (BACTRIM DS,SEPTRA DS) 800-160 MG tablet Take 1 tablet by mouth 2 (two) times daily. 06/20/18  Yes Kari BaarsHawkins, Edward, MD  clotrimazole-betamethasone (LOTRISONE) cream Apply topically 2 (two) times daily. 06/20/18   Kari BaarsHawkins, Edward, MD  mupirocin ointment Idelle Jo(BACTROBAN) 2 % Use on any cuts or skin breakdown 06/20/18   Kari BaarsHawkins, Edward, MD  nicotine (NICODERM CQ - DOSED IN MG/24 HOURS) 21 mg/24hr patch Place 1 patch (21 mg total) onto the skin daily. 03/27/18   Kari BaarsHawkins, Edward, MD  spironolactone (ALDACTONE) 25 MG tablet Take 25 mg by mouth daily.      [provider]    Physical Exam: Vitals:   11/19/18 0249 11/19/18 0250  BP: 129/69   Pulse: (!) 115   Resp: 20   Temp: 100.2 F (37.9 C)   TempSrc: Oral   SpO2: 97%   Weight:  87.5 kg  Height:  4\' 11"  (1.499 m)    Constitutional: NAD, calm, comfortable Eyes: PERRL, lids and conjunctivae normal ENMT: Mucous membranes are moist. Posterior pharynx clear of any exudate or lesions. Neck: normal, supple, no masses, no thyromegaly Respiratory: clear to auscultation bilaterally, no wheezing, no crackles. Normal respiratory effort. No accessory muscle use.  Cardiovascular: Regular rate and rhythm, no murmurs / rubs / gallops. No extremity edema. 2+ pedal pulses. No carotid bruits.  Abdomen: Soft, no tenderness, no masses palpated. No hepatosplenomegaly. Bowel sounds positive.  Musculoskeletal: no clubbing / cyanosis. No joint deformity upper and lower extremities. Good ROM, no contractures. Normal muscle tone.  Skin: Right-sided drain abscess with erythema, surrounding edema, calor and tenderness to palpation. Neurologic: CN 2-12 grossly intact. Sensation intact, DTR normal. Strength 5/5 in all 4.  Psychiatric: Normal judgment and insight. Alert and oriented x  3. Normal mood.       Labs on Admission: I have personally reviewed following labs and imaging studies  CBC: Recent Labs  Lab 11/19/18 0330  WBC 17.4*  NEUTROABS 15.1*  HGB 15.0  HCT 45.1  MCV 89.5  PLT 243   Basic Metabolic Panel: Recent Labs  Lab 11/19/18 0330  NA 137  K 4.1  CL 105  CO2 23  GLUCOSE 145*  BUN 20  CREATININE 0.83  CALCIUM 9.1   GFR: Estimated Creatinine Clearance: 78 mL/min (by C-G formula based on SCr of 0.83 mg/dL). Liver Function Tests: Recent Labs  Lab 11/19/18 0330  AST 21  ALT 21  ALKPHOS 44  BILITOT 0.6  PROT 7.3  ALBUMIN 3.7   No results for input(s): LIPASE, AMYLASE in the last 168 hours. No results for input(s): AMMONIA in the last 168 hours. Coagulation Profile: No results for input(s): INR, PROTIME in the last 168 hours. Cardiac Enzymes: No results for input(s): CKTOTAL, CKMB, CKMBINDEX, TROPONINI in the last 168 hours. BNP (last 3 results) No results for input(s): PROBNP in the last 8760 hours.  HbA1C: No results for input(s): HGBA1C in the last 72 hours. CBG: No results for input(s): GLUCAP in the last 168 hours. Lipid Profile: No results for input(s): CHOL, HDL, LDLCALC, TRIG, CHOLHDL, LDLDIRECT in the last 72 hours. Thyroid Function Tests: No results for input(s): TSH, T4TOTAL, FREET4, T3FREE, THYROIDAB in the last 72 hours. Anemia Panel: No results for input(s): VITAMINB12, FOLATE, FERRITIN, TIBC, IRON, RETICCTPCT in the last 72 hours. Urine analysis:    Component Value Date/Time   COLORURINE YELLOW 11/19/2018 0330   APPEARANCEUR CLEAR 11/19/2018 0330   LABSPEC 1.025 11/19/2018 0330   PHURINE 6.0 11/19/2018 0330   GLUCOSEU NEGATIVE 11/19/2018 0330   HGBUR NEGATIVE 11/19/2018 0330   BILIRUBINUR NEGATIVE 11/19/2018 0330   KETONESUR NEGATIVE 11/19/2018 0330   PROTEINUR NEGATIVE 11/19/2018 0330   UROBILINOGEN 1.0 12/28/2009 1109   NITRITE NEGATIVE 11/19/2018 0330   LEUKOCYTESUR SMALL (A) 11/19/2018 0330     Radiological Exams on Admission: Dg Chest 2 View  Result Date: 11/19/2018 CLINICAL DATA:  Initial evaluation for acute fever, cough. EXAM: CHEST - 2 VIEW COMPARISON:  Prior radiograph from 08/31/2011. FINDINGS: Transverse heart size at the upper limits of normal. Mediastinal silhouette within normal limits. Lungs normally inflated. Mild scattered bronchitic changes. No consolidative opacity. No pulmonary edema or pleural effusion. No pneumothorax. No acute osseous abnormality. IMPRESSION: Mild scattered peribronchial thickening, which could reflect sequelae of acute bronchiolitis given provided history of cough. No consolidative opacity to suggest pneumonia. Electronically Signed   By: Rise MuBenjamin  McClintock M.D.   On: 11/19/2018 04:41    EKG: Independently reviewed. Vent. rate 103 BPM PR interval * ms QRS duration 88 ms QT/QTc 342/448 ms P-R-T axes 68 -8 93 Sinus tachycardia Consider right atrial enlargement  Assessment/Plan Principal Problem:   Cellulitis of abdominal wall Observation/MedSurg. Analgesics as needed. Antiemetics as needed. Continue ceftriaxone 2 g IVPB every 24 hours. Vancomycin per pharmacy. Follow-up cultures and sensitivity.  Active Problems:   Hypothyroidism Continue levothyroxine 50 mcg p.o. daily. Check TSH as needed.    Hypertension Continue lisinopril 20 mg p.o. daily. Continue Aldactone 25 mg p.o. daily Monitor BP, renal function and electrolytes.    Depression with anxiety Continue Zoloft 200 mg p.o. daily.    Type 2 diabetes mellitus (HCC) Last hemoglobin A1c was 7.6% in Mar 13, 2018. Carbohydrate modified diet. CBG monitoring with regular insulin sliding scale while in the hospital.    DVT prophylaxis: Lovenox SQ. Code Status: Full code. Family Communication: Disposition Plan: Admit for IV antibiotics for 2 to 3 days. Consults called:  Admission status: Observation/telemetry.   Bobette Moavid Manuel Davaris Youtsey MD [facility]  If 7PM-7AM,  please contact night-coverage [email or web address] Password [email or web address]  11/19/2018, 6:46 AM

## 2018-11-19 NOTE — ED Notes (Signed)
EDP in room  

## 2018-11-20 DIAGNOSIS — E039 Hypothyroidism, unspecified: Secondary | ICD-10-CM

## 2018-11-20 DIAGNOSIS — I10 Essential (primary) hypertension: Secondary | ICD-10-CM

## 2018-11-20 DIAGNOSIS — F418 Other specified anxiety disorders: Secondary | ICD-10-CM

## 2018-11-20 LAB — GLUCOSE, CAPILLARY
GLUCOSE-CAPILLARY: 67 mg/dL — AB (ref 70–99)
Glucose-Capillary: 95 mg/dL (ref 70–99)
Glucose-Capillary: 99 mg/dL (ref 70–99)

## 2018-11-20 LAB — CBC WITH DIFFERENTIAL/PLATELET
Abs Immature Granulocytes: 0.02 10*3/uL (ref 0.00–0.07)
BASOS ABS: 0 10*3/uL (ref 0.0–0.1)
Basophils Relative: 0 %
EOS ABS: 0.4 10*3/uL (ref 0.0–0.5)
Eosinophils Relative: 6 %
HCT: 42.5 % (ref 36.0–46.0)
Hemoglobin: 13.4 g/dL (ref 12.0–15.0)
IMMATURE GRANULOCYTES: 0 %
Lymphocytes Relative: 38 %
Lymphs Abs: 2.7 10*3/uL (ref 0.7–4.0)
MCH: 29 pg (ref 26.0–34.0)
MCHC: 31.5 g/dL (ref 30.0–36.0)
MCV: 92 fL (ref 80.0–100.0)
Monocytes Absolute: 0.5 10*3/uL (ref 0.1–1.0)
Monocytes Relative: 6 %
NEUTROS PCT: 50 %
NRBC: 0 % (ref 0.0–0.2)
Neutro Abs: 3.5 10*3/uL (ref 1.7–7.7)
Platelets: 205 10*3/uL (ref 150–400)
RBC: 4.62 MIL/uL (ref 3.87–5.11)
RDW: 13.8 % (ref 11.5–15.5)
WBC: 7.1 10*3/uL (ref 4.0–10.5)

## 2018-11-20 LAB — BASIC METABOLIC PANEL
Anion gap: 5 (ref 5–15)
BUN: 13 mg/dL (ref 6–20)
CO2: 24 mmol/L (ref 22–32)
Calcium: 8.6 mg/dL — ABNORMAL LOW (ref 8.9–10.3)
Chloride: 112 mmol/L — ABNORMAL HIGH (ref 98–111)
Creatinine, Ser: 0.65 mg/dL (ref 0.44–1.00)
GFR calc Af Amer: >60 mL/min (ref >60)
GFR calc non Af Amer: >60 mL/min (ref >60)
Glucose, Bld: 97 mg/dL (ref 70–99)
Potassium: 4.1 mmol/L (ref 3.5–5.1)
Sodium: 141 mmol/L (ref 135–145)

## 2018-11-20 MED ORDER — LEVOTHYROXINE SODIUM 50 MCG PO TABS
50.0000 ug | ORAL_TABLET | Freq: Every day | ORAL | Status: DC
  Administered 2018-11-20: 50 ug via ORAL
  Filled 2018-11-20: qty 1

## 2018-11-20 MED ORDER — IBUPROFEN 600 MG PO TABS: 600.0000 mg | ORAL_TABLET | Freq: Four times a day (QID) | ORAL | 0 refills | Status: DC | PRN

## 2018-11-20 MED ORDER — SERTRALINE HCL 50 MG PO TABS
200.0000 mg | ORAL_TABLET | Freq: Every day | ORAL | Status: DC
  Administered 2018-11-20: 200 mg via ORAL
  Filled 2018-11-20: qty 4

## 2018-11-20 MED ORDER — SULFAMETHOXAZOLE-TRIMETHOPRIM 800-160 MG PO TABS: 1.0000 | ORAL_TABLET | Freq: Two times a day (BID) | ORAL | 0 refills | Status: DC

## 2018-11-20 MED ORDER — SPIRONOLACTONE 25 MG PO TABS
25.0000 mg | ORAL_TABLET | Freq: Every day | ORAL | Status: DC
  Administered 2018-11-20: 25 mg via ORAL
  Filled 2018-11-20: qty 1

## 2018-11-20 MED ORDER — LISINOPRIL 10 MG PO TABS
20.0000 mg | ORAL_TABLET | Freq: Every day | ORAL | Status: DC
  Administered 2018-11-20: 20 mg via ORAL
  Filled 2018-11-20: qty 2

## 2018-11-20 MED ORDER — NICOTINE 21 MG/24HR TD PT24
21.0000 mg | MEDICATED_PATCH | Freq: Every day | TRANSDERMAL | Status: DC
  Administered 2018-11-20: 21 mg via TRANSDERMAL
  Filled 2018-11-20: qty 1

## 2018-11-20 NOTE — Progress Notes (Addendum)
Hypoglycemic Event  CBG: 67  Treatment: Juice    Symptoms: none   Follow-up CBG: Time:1205 CBG Result:99  Possible Reasons for Event:    Comments/MD notified:Memon     Maria Vargas

## 2018-11-20 NOTE — Discharge Summary (Signed)
Physician Discharge Summary  Maria Vargas YIR:[id number] DOB: 1966-06-15 DOA: 11/19/2018  PCP: Kari Baars, MD  Admit date: 11/19/2018 Discharge date: 11/20/2018  Admitted From: home Disposition:  home  Recommendations for Outpatient Follow-up:  1. Follow up with PCP in 1-2 weeks 2. Please obtain BMP/CBC in one week  Discharge Condition: Stable CODE STATUS: Full code Diet recommendation: Heart healthy, carb modified  Brief/Interim Summary: 53 year old female with a history of hypertension and diabetes, admitted to the hospital with abdominal wall cellulitis and a right lower quadrant abdominal wall abscess.  She underwent incision and drainage in the emergency room and was started on intravenous antibiotics.  Overall leukocytosis has resolved.  She has had some drainage from incision site and overall erythema has improved.  She is feeling significantly better and wants to discharge home.  She will be continued on a course of Bactrim.  The remainder of her medical problems remained stable.  Discharge Diagnoses:  Principal Problem:   Cellulitis of abdominal wall Active Problems:   Hypothyroidism   Hypertension   Depression with anxiety   Type 2 diabetes mellitus [facility])    Discharge Instructions  Discharge Instructions    Diet - low sodium heart healthy   Complete by:  As directed    Increase activity slowly   Complete by:  As directed      Allergies as of 11/20/2018      Reactions   Clindamycin/lincomycin Rash   Amoxicillin-pot Clavulanate Other (See Comments)   Bad yeast infection   Ultram [tramadol Hcl] Nausea And Vomiting      Medication List    TAKE these medications   clotrimazole-betamethasone cream Commonly known as:  LOTRISONE Apply topically 2 (two) times daily.   FISH OIL + D3 PO Take 1 capsule by mouth daily.   ibuprofen 600 MG tablet Commonly known as:  ADVIL,MOTRIN Take 1 tablet (600 mg total) by mouth every 6 (six) hours as needed. What  changed:    when to take this  reasons to take this   levothyroxine 50 MCG tablet Commonly known as:  SYNTHROID, LEVOTHROID Take 50 mcg by mouth daily.   lisinopril 20 MG tablet Commonly known as:  PRINIVIL,ZESTRIL Take 20 mg by mouth daily.   metFORMIN 500 MG tablet Commonly known as:  GLUCOPHAGE Take 1 tablet (500 mg total) by mouth 2 (two) times daily with a meal. What changed:    when to take this  reasons to take this   MULTIVITAMIN ADULT PO Take 1 tablet by mouth daily.   mupirocin ointment 2 % Commonly known as:  BACTROBAN Use on any cuts or skin breakdown   sertraline 100 MG tablet Commonly known as:  ZOLOFT Take 200 mg by mouth daily.   spironolactone 25 MG tablet Commonly known as:  ALDACTONE Take 25 mg by mouth daily as needed.   sulfamethoxazole-trimethoprim 800-160 MG tablet Commonly known as:  BACTRIM DS,SEPTRA DS Take 1 tablet by mouth 2 (two) times daily.      Follow-up Information    Kari Baars, MD. Schedule an appointment as soon as possible for a visit in 1 week(s).   Specialty:  Pulmonary Disease Contact information: [address] [place] [address] [phone]          Allergies  Allergen Reactions  . Clindamycin/Lincomycin Rash  . Amoxicillin-Pot Clavulanate Other (See Comments)    Bad yeast infection  . Ultram [Tramadol Hcl] Nausea And Vomiting    Consultations:     Procedures/Studies: Dg Chest  2 View  Result Date: 11/19/2018 CLINICAL DATA:  Initial evaluation for acute fever, cough. EXAM: CHEST - 2 VIEW COMPARISON:  Prior radiograph from 08/31/2011. FINDINGS: Transverse heart size at the upper limits of normal. Mediastinal silhouette within normal limits. Lungs normally inflated. Mild scattered bronchitic changes. No consolidative opacity. No pulmonary edema or pleural effusion. No pneumothorax. No acute osseous abnormality. IMPRESSION: Mild scattered peribronchial thickening, which could reflect sequelae of  acute bronchiolitis given provided history of cough. No consolidative opacity to suggest pneumonia. Electronically Signed   By: Rise MuBenjamin  McClintock M.D.   On: 11/19/2018 04:41       Subjective: Feeling better.  Abdomen is less painful.  She did notice some drainage overnight from her abdominal wound.  Discharge Exam: Vitals:   11/19/18 0715 11/19/18 1442 11/19/18 2138 11/20/18 0600  BP: (!) 131/51 (!) 94/45 (!) 120/49 132/74  Pulse: 87 77 62 67  Resp: 20 20  18   Temp: 98.4 F (36.9 C) 98 F (36.7 C) 98.4 F (36.9 C) 98 F (36.7 C)  TempSrc: Oral Oral Oral Oral  SpO2: 97% 98% 99% 100%  Weight: 89.1 kg     Height: 4\' 11"  (1.499 m)       General: Pt is alert, awake, not in acute distress Cardiovascular: RRR, S1/S2 +, no rubs, no gallops Respiratory: CTA bilaterally, no wheezing, no rhonchi Abdominal: Soft, NT, ND, bowel sounds +, erythema improving.  Area of induration is smaller. Extremities: no edema, no cyanosis    The results of significant diagnostics from this hospitalization (including imaging, microbiology, ancillary and laboratory) are listed below for reference.     Microbiology: Recent Results (from the past 240 hour(s))  Blood Culture (routine x 2)     Status: None (Preliminary result)   Collection Time: 11/19/18  3:47 AM  Result Value Ref Range Status   Specimen Description BLOOD RIGHT ANTECUBITAL  Final   Special Requests   Final    BOTTLES DRAWN AEROBIC AND ANAEROBIC Blood Culture adequate volume   Culture   Final    NO GROWTH 1 DAY Performed at [facility], [address] [place], [place] [address]    Report Status PENDING  Incomplete  Blood Culture (routine x 2)     Status: None (Preliminary result)   Collection Time: 11/19/18  3:56 AM  Result Value Ref Range Status   Specimen Description BLOOD LEFT ANTECUBITAL  Final   Special Requests   Final    BOTTLES DRAWN AEROBIC AND ANAEROBIC Blood Culture adequate volume   Culture   Final    NO GROWTH 1  DAY Performed at [facility], [address] [place], [place] [address]    Report Status PENDING  Incomplete     Labs: BNP (last 3 results) No results for input(s): BNP in the last 8760 hours. Basic Metabolic Panel: Recent Labs  Lab 11/19/18 0330 11/20/18 0654  NA 137 141  K 4.1 4.1  CL 105 112*  CO2 23 24  GLUCOSE 145* 97  BUN 20 13  CREATININE 0.83 0.65  CALCIUM 9.1 8.6*   Liver Function Tests: Recent Labs  Lab 11/19/18 0330  AST 21  ALT 21  ALKPHOS 44  BILITOT 0.6  PROT 7.3  ALBUMIN 3.7   No results for input(s): LIPASE, AMYLASE in the last 168 hours. No results for input(s): AMMONIA in the last 168 hours. CBC: Recent Labs  Lab 11/19/18 0330 11/20/18 0654  WBC 17.4* 7.1  NEUTROABS 15.1* 3.5  HGB 15.0 13.4  HCT 45.1 42.5  MCV 89.5 92.0  PLT 243 205   Cardiac Enzymes: No results for input(s): CKTOTAL, CKMB, CKMBINDEX, TROPONINI in the last 168 hours. BNP: Invalid input(s): POCBNP CBG: Recent Labs  Lab 11/19/18 1559 11/19/18 2140 11/20/18 0752 11/20/18 1122 11/20/18 1205  GLUCAP 79 99 95 67* 99   D-Dimer No results for input(s): DDIMER in the last 72 hours. Hgb A1c Recent Labs    11/19/18 0330  HGBA1C 6.4*   Lipid Profile No results for input(s): CHOL, HDL, LDLCALC, TRIG, CHOLHDL, LDLDIRECT in the last 72 hours. Thyroid function studies No results for input(s): TSH, T4TOTAL, T3FREE, THYROIDAB in the last 72 hours.  Invalid input(s): FREET3 Anemia work up No results for input(s): VITAMINB12, FOLATE, FERRITIN, TIBC, IRON, RETICCTPCT in the last 72 hours. Urinalysis    Component Value Date/Time   COLORURINE YELLOW 11/19/2018 0330   APPEARANCEUR CLEAR 11/19/2018 0330   LABSPEC 1.025 11/19/2018 0330   PHURINE 6.0 11/19/2018 0330   GLUCOSEU NEGATIVE 11/19/2018 0330   HGBUR NEGATIVE 11/19/2018 0330   BILIRUBINUR NEGATIVE 11/19/2018 0330   KETONESUR NEGATIVE 11/19/2018 0330   PROTEINUR NEGATIVE 11/19/2018 0330   UROBILINOGEN 1.0  12/28/2009 1109   NITRITE NEGATIVE 11/19/2018 0330   LEUKOCYTESUR SMALL (A) 11/19/2018 0330   Sepsis Labs Invalid input(s): PROCALCITONIN,  WBC,  LACTICIDVEN Microbiology Recent Results (from the past 240 hour(s))  Blood Culture (routine x 2)     Status: None (Preliminary result)   Collection Time: 11/19/18  3:47 AM  Result Value Ref Range Status   Specimen Description BLOOD RIGHT ANTECUBITAL  Final   Special Requests   Final    BOTTLES DRAWN AEROBIC AND ANAEROBIC Blood Culture adequate volume   Culture   Final    NO GROWTH 1 DAY Performed at [facility], [address] [place], [place] [address]    Report Status PENDING  Incomplete  Blood Culture (routine x 2)     Status: None (Preliminary result)   Collection Time: 11/19/18  3:56 AM  Result Value Ref Range Status   Specimen Description BLOOD LEFT ANTECUBITAL  Final   Special Requests   Final    BOTTLES DRAWN AEROBIC AND ANAEROBIC Blood Culture adequate volume   Culture   Final    NO GROWTH 1 DAY Performed at [facility], [address] [place], [place] [address]    Report Status PENDING  Incomplete     Time coordinating discharge: 35mins  SIGNED:   Erick BlinksJehanzeb Larraine Argo, MD  [facility] 11/20/2018, 7:18 PM Pager   If 7PM-7AM, please contact night-coverage [email or web address] Password [email or web address]

## 2018-11-20 NOTE — Progress Notes (Signed)
Patient IV removed, tolerated well. Patient given discharge instructions at bedside.  

## 2018-11-24 LAB — CULTURE, BLOOD (ROUTINE X 2)
CULTURE: NO GROWTH
CULTURE: NO GROWTH
Special Requests: ADEQUATE
Special Requests: ADEQUATE

## 2019-07-25 ENCOUNTER — Encounter (HOSPITAL_COMMUNITY): Payer: Self-pay | Admitting: Emergency Medicine

## 2019-07-25 ENCOUNTER — Emergency Department (HOSPITAL_COMMUNITY)
Admission: EM | Admit: 2019-07-25 | Discharge: 2019-07-25 | Disposition: A | Discharge status: Home / Self Care | Payer: Self-pay | Attending: Emergency Medicine | Admitting: Emergency Medicine

## 2019-07-25 ENCOUNTER — Other Ambulatory Visit: Payer: Self-pay

## 2019-07-25 ENCOUNTER — Emergency Department (HOSPITAL_COMMUNITY): Payer: Self-pay

## 2019-07-25 DIAGNOSIS — I10 Essential (primary) hypertension: Secondary | ICD-10-CM | POA: Insufficient documentation

## 2019-07-25 DIAGNOSIS — E039 Hypothyroidism, unspecified: Secondary | ICD-10-CM | POA: Insufficient documentation

## 2019-07-25 DIAGNOSIS — Z79899 Other long term (current) drug therapy: Secondary | ICD-10-CM | POA: Insufficient documentation

## 2019-07-25 DIAGNOSIS — E119 Type 2 diabetes mellitus without complications: Secondary | ICD-10-CM | POA: Insufficient documentation

## 2019-07-25 DIAGNOSIS — X58XXXS Exposure to other specified factors, sequela: Secondary | ICD-10-CM | POA: Insufficient documentation

## 2019-07-25 DIAGNOSIS — Z7984 Long term (current) use of oral hypoglycemic drugs: Secondary | ICD-10-CM | POA: Insufficient documentation

## 2019-07-25 DIAGNOSIS — F17210 Nicotine dependence, cigarettes, uncomplicated: Secondary | ICD-10-CM | POA: Insufficient documentation

## 2019-07-25 DIAGNOSIS — S76011S Strain of muscle, fascia and tendon of right hip, sequela: Secondary | ICD-10-CM | POA: Insufficient documentation

## 2019-07-25 MED ORDER — KETOROLAC TROMETHAMINE 60 MG/2ML IM SOLN
60.0000 mg | Freq: Once | INTRAMUSCULAR | Status: AC
  Administered 2019-07-25: 60 mg via INTRAMUSCULAR
  Filled 2019-07-25: qty 2

## 2019-07-25 MED ORDER — NAPROXEN 500 MG PO TABS: 500.0000 mg | ORAL_TABLET | Freq: Two times a day (BID) | ORAL | 0 refills | Status: DC

## 2019-07-25 MED ORDER — CYCLOBENZAPRINE HCL 10 MG PO TABS
10.0000 mg | ORAL_TABLET | Freq: Once | ORAL | Status: AC
  Administered 2019-07-25: 10 mg via ORAL
  Filled 2019-07-25: qty 1

## 2019-07-25 MED ORDER — CYCLOBENZAPRINE HCL 10 MG PO TABS: 10.0000 mg | ORAL_TABLET | Freq: Two times a day (BID) | ORAL | 0 refills | Status: AC | PRN

## 2019-07-25 NOTE — Discharge Instructions (Signed)
Thank you for allowing me to care for you today. Please return to the emergency department if you have new or worsening symptoms. Take your medications as instructed.  ° °

## 2019-07-25 NOTE — ED Provider Notes (Signed)
[facility] EMERGENCY DEPARTMENT Provider Note   CSN: [id number] Arrival date & time: 07/25/19  1024     History   Chief Complaint Chief Complaint  Patient presents with  . Leg Pain    HPI Maria Vargas is a 53 y.o. female.     Patient is a 53 year old female with past medical history of anxiety, asthma, depression, diabetes, hypertension, thyroid disease presenting to the emergency department for right-sided posterior back pain radiating to her posterior thigh.  Patient reports that this began yesterday without any injury or trauma.  Reports that the pain is aching and is worse with movement and worse with laying flat on her back.  Denies any numbness, tingling, weakness, saddle anesthesia, fever, leg swelling, history of any blood clots.  She has not tried anything for relief.     Past Medical History:  Diagnosis Date  . Anxiety   . Asthma   . Depression   . Diabetes mellitus without complication (HCC)   . Hypertension   . Thyroid disease   . Tobacco use     Patient Active Problem List   Diagnosis Date Noted  . Cellulitis of abdominal wall 11/19/2018  . Cellulitis of pubic region 06/18/2018  . Hypertension 06/18/2018  . Depression with anxiety 06/18/2018  . Type 2 diabetes mellitus (HCC) 06/18/2018  . Cellulitis 03/22/2018  . Hypothyroidism 03/22/2018  . Cellulitis and abscess of leg     Past Surgical History:  Procedure Laterality Date  . CESAREAN SECTION    . CYSTOSCOPY W/ DILATION OF BLADDER     x2  . ENDOMETRIAL ABLATION    . HYSTEROSCOPY    . TONSILLECTOMY       OB History   No obstetric history on file.      Home Medications    Prior to Admission medications   Medication Sig Start Date End Date Taking? Authorizing Provider  clotrimazole-betamethasone (LOTRISONE) cream Apply topically 2 (two) times daily. 06/20/18   Kari Baars, MD  cyclobenzaprine (FLEXERIL) 10 MG tablet Take 1 tablet (10 mg total) by mouth 2 (two) times daily as  needed for up to 7 days for muscle spasms. 07/25/19 08/01/19  Arlyn Dunning, PA-C  Fish Oil-Cholecalciferol (FISH OIL + D3 PO) Take 1 capsule by mouth daily.    [provider]  ibuprofen (ADVIL,MOTRIN) 600 MG tablet Take 1 tablet (600 mg total) by mouth every 6 (six) hours as needed. 11/20/18   Erick Blinks, MD  levothyroxine (SYNTHROID, LEVOTHROID) 50 MCG tablet Take 50 mcg by mouth daily.      [provider]  lisinopril (PRINIVIL,ZESTRIL) 20 MG tablet Take 20 mg by mouth daily. 05/07/18   [provider]  metFORMIN (GLUCOPHAGE) 500 MG tablet Take 1 tablet (500 mg total) by mouth 2 (two) times daily with a meal. Patient taking differently: Take 500 mg by mouth 2 (two) times daily as needed.  03/26/18 03/26/19  Kari Baars, MD  Multiple Vitamins-Minerals (MULTIVITAMIN ADULT PO) Take 1 tablet by mouth daily.     [provider]  mupirocin ointment (BACTROBAN) 2 % Use on any cuts or skin breakdown 06/20/18   Kari Baars, MD  naproxen (NAPROSYN) 500 MG tablet Take 1 tablet (500 mg total) by mouth 2 (two) times daily. 07/25/19   Arlyn Dunning, PA-C  sertraline (ZOLOFT) 100 MG tablet Take 200 mg by mouth daily.     [provider]  spironolactone (ALDACTONE) 25 MG tablet Take 25 mg by mouth daily as  needed.     [provider]  sulfamethoxazole-trimethoprim (BACTRIM DS,SEPTRA DS) 800-160 MG tablet Take 1 tablet by mouth 2 (two) times daily. 11/20/18   Kathie Dike, MD    Family History Family History  Problem Relation Age of Onset  . Diabetes Mellitus II Mother   . Hypertension Mother   . Hypertension Father   . Arthritis Father   . Heart disease Paternal Grandmother   . Brain cancer Maternal Grandfather   . Parkinson's disease Maternal Uncle   . Alzheimer's disease Paternal Aunt     Social History Social History   Tobacco Use  . Smoking status: Current Every Day Smoker    Packs/day: 1.00    Types: Cigarettes  . Smokeless  tobacco: Never Used  Substance Use Topics  . Alcohol use: Yes    Comment: rarely  . Drug use: Never     Allergies   Clindamycin/lincomycin, Amoxicillin-pot clavulanate, and Ultram [tramadol hcl]   Review of Systems Review of Systems  Constitutional: Negative for fever.  Respiratory: Negative for cough and shortness of breath.   Cardiovascular: Negative for chest pain, palpitations and leg swelling.  Musculoskeletal: Positive for arthralgias, back pain and gait problem. Negative for joint swelling, myalgias, neck pain and neck stiffness.  Skin: Negative for color change, rash and wound.  Neurological: Negative for dizziness, light-headedness and headaches.  All other systems reviewed and are negative.    Physical Exam Updated Vital Signs BP 138/80   Pulse 89   Temp 98.1 F (36.7 C) (Oral)   Resp 16   Ht 4\' 10"  (1.473 m)   Wt 99.8 kg   SpO2 98%   BMI 45.98 kg/m   Physical Exam Vitals signs and nursing note reviewed.  Constitutional:      Appearance: Normal appearance.  HENT:     Head: Normocephalic.  Eyes:     Conjunctiva/sclera: Conjunctivae normal.  Pulmonary:     Effort: Pulmonary effort is normal.  Musculoskeletal:     Right lower leg: No edema.     Left lower leg: No edema.     Comments: Patient has a normal straight leg raise test.  Patient has reproducible pain with abduction of the right side.  There is no swelling in the leg,  negative Homans sign  Skin:    General: Skin is warm and dry.     Capillary Refill: Capillary refill takes less than 2 seconds.  Neurological:     Mental Status: She is alert.     Sensory: No sensory deficit.     Motor: No weakness.     Gait: Gait normal.     Deep Tendon Reflexes: Reflexes normal.     Comments: Normal distal pulses.  Psychiatric:        Mood and Affect: Mood normal.      ED Treatments / Results  Labs (all labs ordered are listed, but only abnormal results are displayed) Labs Reviewed - No data to  display  EKG None  Radiology Dg Lumbar Spine 2-3 Views  Result Date: 07/25/2019 CLINICAL DATA:  Low back pain.  No injury. EXAM: LUMBAR SPINE - 2-3 VIEW COMPARISON:  None. FINDINGS: Four 9 bearing lumbar vertebrae. Vertebral body alignment and heights are normal. No significant disc space narrowing. Mild facet arthropathy over the lower lumbar spine. IMPRESSION: No acute findings. Electronically Signed   By: Marin Olp M.D.   On: 07/25/2019 12:52   Dg Hip Unilat W Or Wo Pelvis 2-3 Views Right  Result  Date: 07/25/2019 CLINICAL DATA:  Posterior right thigh pain beginning last night. No injury. EXAM: DG HIP (WITH OR WITHOUT PELVIS) 2-3V RIGHT COMPARISON:  05/09/2011 FINDINGS: Minimal symmetric degenerative change of the hips. No evidence of acute fracture or dislocation. Remainder the exam is unchanged. IMPRESSION: No acute findings. 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 Electronically Signed   By: Elberta Fortisaniel  Boyle M.D.   On: 07/25/2019 12:53    Procedures Procedures (including critical care time)  Medications Ordered in ED Medications  ketorolac (TORADOL) injection 60 mg (60 mg Intramuscular Given 07/25/19 1223)  cyclobenzaprine (FLEXERIL) tablet 10 mg (10 mg Oral Given 07/25/19 1223)     Initial Impression / Assessment and Plan / ED Course  I have reviewed the triage vital signs and the nursing notes.  Pertinent labs & imaging results that were available during my care of the patient were reviewed by me and considered in my medical decision making (see chart for details).  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  No signs of cauda equina or DVT.  Patient got good improvement with Flexeril and Toradol.  Will send home with naproxen and Flexeril.  Advised to do light stretching and advised on strict return precautions.   [KM]    Clinical Course User Index [KM]  Arlyn DunningMcLean, Trinton Prewitt A, PA-C       Based on review of vitals, medical screening exam, lab work and/or imaging, there does not appear to be an acute, emergent etiology for the patient's symptoms. Counseled pt on good return precautions and encouraged both PCP and ED follow-up as needed.  Prior to discharge, I also discussed incidental imaging findings with patient in detail and advised appropriate, recommended follow-up in detail.  Clinical Impression: 1. Hip strain, right, sequela     Disposition: Discharge  Prior to providing a prescription for a controlled substance, I independently reviewed the patient's recent prescription history on the [place] Controlled Substance Reporting System. The patient had no recent or regular prescriptions and was deemed appropriate for a brief, less than 3 day prescription of narcotic for acute analgesia.  This note was prepared with assistance of Conservation officer, historic buildingsDragon voice recognition software. Occasional wrong-word or sound-a-like substitutions may have occurred due to the inherent limitations of voice recognition software.   Final Clinical Impressions(s) / ED Diagnoses   Final diagnoses:  Hip strain, right, sequela    ED Discharge Orders         Ordered    naproxen (NAPROSYN) 500 MG tablet  2 times daily     07/25/19 1323    cyclobenzaprine (FLEXERIL) 10 MG tablet  2 times daily PRN     07/25/19 1323           Arlyn DunningMcLean, Jhony Antrim A, PA-C 07/25/19 1324    Samuel JesterMcManus, Kathleen, DO 07/28/19 1104

## 2019-07-25 NOTE — ED Notes (Signed)
Patient transported to X-ray 

## 2019-07-25 NOTE — ED Triage Notes (Signed)
Pt c/o posterior RT thigh pain that began last night. Pt denies injury. States pain worsens with movement, palpation, and ambulation.

## 2019-08-24 ENCOUNTER — Other Ambulatory Visit: Payer: Self-pay

## 2019-08-24 ENCOUNTER — Emergency Department (HOSPITAL_COMMUNITY)
Admission: EM | Admit: 2019-08-24 | Discharge: 2019-08-24 | Disposition: A | Discharge status: Home / Self Care | Payer: Self-pay | Attending: Emergency Medicine | Admitting: Emergency Medicine

## 2019-08-24 ENCOUNTER — Emergency Department (HOSPITAL_COMMUNITY): Payer: Self-pay

## 2019-08-24 ENCOUNTER — Encounter (HOSPITAL_COMMUNITY): Payer: Self-pay | Admitting: Emergency Medicine

## 2019-08-24 DIAGNOSIS — F17210 Nicotine dependence, cigarettes, uncomplicated: Secondary | ICD-10-CM | POA: Insufficient documentation

## 2019-08-24 DIAGNOSIS — E039 Hypothyroidism, unspecified: Secondary | ICD-10-CM | POA: Insufficient documentation

## 2019-08-24 DIAGNOSIS — E119 Type 2 diabetes mellitus without complications: Secondary | ICD-10-CM | POA: Insufficient documentation

## 2019-08-24 DIAGNOSIS — Z7984 Long term (current) use of oral hypoglycemic drugs: Secondary | ICD-10-CM | POA: Insufficient documentation

## 2019-08-24 DIAGNOSIS — J45909 Unspecified asthma, uncomplicated: Secondary | ICD-10-CM | POA: Insufficient documentation

## 2019-08-24 DIAGNOSIS — Z79899 Other long term (current) drug therapy: Secondary | ICD-10-CM | POA: Insufficient documentation

## 2019-08-24 DIAGNOSIS — M62838 Other muscle spasm: Secondary | ICD-10-CM | POA: Insufficient documentation

## 2019-08-24 DIAGNOSIS — I10 Essential (primary) hypertension: Secondary | ICD-10-CM | POA: Insufficient documentation

## 2019-08-24 DIAGNOSIS — M25511 Pain in right shoulder: Secondary | ICD-10-CM | POA: Insufficient documentation

## 2019-08-24 MED ORDER — IBUPROFEN 800 MG PO TABS: 800.0000 mg | ORAL_TABLET | Freq: Three times a day (TID) | ORAL | 0 refills | Status: DC

## 2019-08-24 MED ORDER — KETOROLAC TROMETHAMINE 30 MG/ML IJ SOLN
30.0000 mg | Freq: Once | INTRAMUSCULAR | Status: AC
  Administered 2019-08-24: 20:00:00 30 mg via INTRAMUSCULAR
  Filled 2019-08-24: qty 1

## 2019-08-24 NOTE — ED Triage Notes (Signed)
Pt has hx of herniated desk in neck. Having increased neck and RT arm pain x 1 week

## 2019-08-24 NOTE — ED Notes (Signed)
Pt back from x-ray.

## 2019-08-24 NOTE — Discharge Instructions (Addendum)
Use a heating pad applied to your shoulder and trapezius area for 20 minutes followed by gentle range of motion (stretching) of this tight muscle as demonstrated 2-3 times daily.  You may continue using your flexeril if desired, but an anti inflammatory (ibuprofen) will be more effective at pain relief.  You may also try taking tylenol 650 mg - 1 tablet with each ibuprofen - this can magnify pain relief.  Call your MD for a recheck if not improving over the next week.  Your xrays are negative for arthritis in your shoulder or collar bone (AC joint).  You may also consider seeing Dr. Aline Brochure of your symptoms persist beyond the next 1-2 weeks.

## 2019-08-24 NOTE — ED Provider Notes (Signed)
[facility]GENCY DEPARTMENT Provider Note   CSN: [id number] Arrival date & time: 08/24/19  1608     History   Chief Complaint Chief Complaint  Patient presents with  . Neck Pain    HPI Elpidio AnisCatherine B Tu is a 53 y.o. 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  She denies numbness or radiation of pain beyond the shoulder, but endorses pain and a pulling sensation at her upper shoulder region with attempts to raise the arm above shoulder level.  No new injuries.  She has had difficulty sleeping as no position has been comfortable.  She is taking flexeril left over from an old injury and has also taken aleve with no significant improvement.  She is left handed.   She reports DM is well controlled, last cbg yesterday 112.  She is diet controlled, no longer takes metformin as it made her nauseated.     The history is provided by the patient.    Past Medical History:  Diagnosis Date  . Anxiety   . Asthma   . Depression   . Diabetes mellitus without complication (HCC)   . Hypertension   . Thyroid disease   . Tobacco use     Patient Active Problem List   Diagnosis Date Noted  . Cellulitis of abdominal wall 11/19/2018  . Cellulitis of pubic region 06/18/2018  . Hypertension 06/18/2018  . Depression with anxiety 06/18/2018  . Type 2 diabetes mellitus (HCC) 06/18/2018  . Cellulitis 03/22/2018  . Hypothyroidism 03/22/2018  . Cellulitis and abscess of leg     Past Surgical History:  Procedure Laterality Date  . CESAREAN SECTION    . CYSTOSCOPY W/ DILATION OF BLADDER     x2  . ENDOMETRIAL ABLATION    . HYSTEROSCOPY    . TONSILLECTOMY       OB History   No obstetric history on file.      Home Medications    Prior to Admission medications   Medication Sig Start Date End Date  Taking? Authorizing Provider  Fish Oil-Cholecalciferol (FISH OIL + D3 PO) Take 1 capsule by mouth every morning.    Yes [provider]  levothyroxine (SYNTHROID, LEVOTHROID) 50 MCG tablet Take 50 mcg by mouth every evening.    Yes [provider]  lisinopril (PRINIVIL,ZESTRIL) 20 MG tablet Take 20 mg by mouth daily. 05/07/18  Yes [provider]  metFORMIN (GLUCOPHAGE) 500 MG tablet Take 1 tablet (500 mg total) by mouth 2 (two) times daily with a meal. Patient taking differently: Take 500 mg by mouth 2 (two) times daily as needed.  03/26/18 08/24/19 Yes Kari BaarsHawkins, Edward, MD  Multiple Vitamins-Minerals (MULTIVITAMIN ADULT PO) Take 1 tablet by mouth every morning.    Yes [provider]  naproxen (NAPROSYN) 500 MG tablet Take 1 tablet (500 mg total) by mouth 2 (two) times daily. Patient taking differently: Take 500 mg by mouth 2 (two) times daily as needed for mild pain or moderate pain.  07/25/19  Yes Ronnie DossMcLean, Kelly A, PA-C  sertraline (ZOLOFT) 100 MG tablet Take 200 mg by mouth every morning.    Yes [provider]  spironolactone (ALDACTONE) 25 MG tablet Take 25 mg by mouth every morning.    Yes [provider]  ibuprofen (ADVIL) 800 MG tablet Take 1 tablet (800 mg total) by  mouth 3 (three) times daily. 08/24/19   Evalee Jefferson, PA-C    Family History Family History  Problem Relation Age of Onset  . Diabetes Mellitus II Mother   . Hypertension Mother   . Hypertension Father   . Arthritis Father   . Heart disease Paternal Grandmother   . Brain cancer Maternal Grandfather   . Parkinson's disease Maternal Uncle   . Alzheimer's disease Paternal Aunt     Social History Social History   Tobacco Use  . Smoking status: Current Every Day Smoker    Packs/day: 1.00    Types: Cigarettes  . Smokeless tobacco: Never Used  Substance Use Topics  . Alcohol use: Yes    Comment: rarely  . Drug use: Never     Allergies   Clindamycin/lincomycin,  Amoxicillin-pot clavulanate, and Ultram [tramadol hcl]   Review of Systems Review of Systems  Constitutional: Negative for fever.  Musculoskeletal: Positive for arthralgias. Negative for joint swelling and myalgias.  Neurological: Negative for weakness and numbness.     Physical Exam Updated Vital Signs BP (!) 156/80 (BP Location: Right Arm)   Pulse 92   Temp 98.4 F (36.9 C) (Oral)   Resp 16   Ht 4\' 10"  (1.473 m)   Wt 99.8 kg   SpO2 97%   BMI 45.98 kg/m   Physical Exam Vitals signs and nursing note reviewed.  Constitutional:      Appearance: She is well-developed.  HENT:     Head: Atraumatic.  Neck:     Musculoskeletal: Normal range of motion.  Cardiovascular:     Rate and Rhythm: Normal rate.     Pulses:          Radial pulses are 2+ on the right side and 2+ on the left side.     Comments: Pulses equal bilaterally Pulmonary:     Effort: Pulmonary effort is normal.     Breath sounds: Normal breath sounds.  Musculoskeletal:        General: Tenderness present. No swelling or deformity.     Right shoulder: She exhibits decreased range of motion and tenderness. She exhibits no bony tenderness, no swelling, no effusion and no crepitus.     Comments: Right shoulder abduction/adduction without pain or difficulty until 90 degrees, "pulling" pain and weakness with attempt to raised beyond 90.    Skin:    General: Skin is warm and dry.  Neurological:     Mental Status: She is alert.     Sensory: No sensory deficit.     Deep Tendon Reflexes: Reflexes normal.     Reflex Scores:      Bicep reflexes are 1+ on the right side and 1+ on the left side.    Comments: Equal grip strength.      ED Treatments / Results  Labs (all labs ordered are listed, but only abnormal results are displayed) Labs Reviewed - No data to display  EKG None  Radiology Dg Shoulder Right  Result Date: 08/24/2019 CLINICAL DATA:  Increasing right arm pain. EXAM: RIGHT SHOULDER - 2+ VIEW  COMPARISON:  None. FINDINGS: There is no evidence of fracture or dislocation. There is no evidence of arthropathy or other focal bone abnormality. Soft tissues are unremarkable. IMPRESSION: Negative. Electronically Signed   By: Fidela Salisbury M.D.   On: 08/24/2019 20:13    Procedures Procedures (including critical care time)  Medications Ordered in ED Medications  ketorolac (TORADOL) 30 MG/ML injection 30 mg (30 mg Intramuscular Given 08/24/19 1941)  Initial Impression / Assessment and Plan / ED Course  I have reviewed the triage vital signs and the nursing notes.  Pertinent labs & imaging results that were available during my care of the patient were reviewed by me and considered in my medical decision making (see chart for details).        Pt with musculoskeletal right shoulder/trapezius spasm/strain.  Discussed heat tx, ROM, ibuprofen and tylenol. F/u with pcp prn.  No neuro deficits suggesting need for emergent MRI imaging of c spine given known ddd of c spine.    Final Clinical Impressions(s) / ED Diagnoses   Final diagnoses:  Acute pain of right shoulder  Muscle spasms of neck    ED Discharge Orders         Ordered    ibuprofen (ADVIL) 800 MG tablet  3 times daily     08/24/19 2044           Victoriano Lain 08/24/19 2051    Bethann Berkshire, MD 08/29/19 1038

## 2019-08-24 NOTE — ED Notes (Signed)
Patient transported to X-ray 

## 2020-10-27 ENCOUNTER — Encounter: Payer: Self-pay | Admitting: Internal Medicine

## 2020-12-14 ENCOUNTER — Ambulatory Visit: Payer: Medicaid Other | Admitting: Gastroenterology

## 2020-12-14 ENCOUNTER — Encounter: Payer: Self-pay | Admitting: Internal Medicine

## 2022-11-01 ENCOUNTER — Encounter: Payer: Self-pay | Admitting: *Deleted

## 2022-11-29 NOTE — Patient Instructions (Signed)
  Procedure: COLONOSCOPY  Estimated body mass index is 43.47 kg/m as calculated from the following:   Height as of this encounter: 4\' 10"  (1.473 m).   Weight as of this encounter: 208 lb (94.3 kg).   Have you had a colonoscopy before?  NO  Do you have family history of colon cancer?  YES, PATERNAL GRANDFATHER  Do you have a family history of polyps? NO  Previous colonoscopy with polyps removed? NO  Do you have a history colorectal cancer?   NO  Are you diabetic?  YES, TYPE 2  Do you have a prosthetic or mechanical heart valve? NO  Do you have a pacemaker/defibrillator?   NO  Have you had endocarditis/atrial fibrillation?  NO  Do you use supplemental oxygen/CPAP?  NO  Have you had joint replacement within the last 12 months?  NO  Do you tend to be constipated or have to use laxatives?  NO   Do you have history of alcohol use? If yes, how much and how often.  YES, GLASS OF WINE 1-3 TIMES PER MONTH  Do you have history or are you using drugs? If yes, what do are you  using?  NO  Have you ever had a stroke/heart attack?  NO  Have you ever had a heart or other vascular stent placed? NO  Do you take weight loss medication? YES  female patients,: have you had a hysterectomy? NO                              are you post menopausal?  YES                              do you still have your menstrual cycle? NO    Date of last menstrual period? 15+ YEARS AGO  Do you take any blood-thinning medications such as: (Plavix, aspirin, Coumadin, Aggrenox, Brilinta, Xarelto, Eliquis, Pradaxa, Savaysa or Effient)? NO  If yes we need the name, milligram, dosage and who is prescribing doctor:  N/A             Current Outpatient Medications  Medication Sig Dispense Refill   lisinopril (PRINIVIL,ZESTRIL) 20 MG tablet Take 20 mg by mouth daily.  12   lovastatin (MEVACOR) 20 MG tablet 1 tablet with the evening meal Orally Once a day for 30 day(s)     metFORMIN (GLUCOPHAGE) 500 MG tablet  Take 1 tablet (500 mg total) by mouth 2 (two) times daily with a meal. (Patient taking differently: Take 500 mg by mouth daily with breakfast.) 60 tablet 11   Semaglutide,0.25 or 0.5MG /DOS, (OZEMPIC, 0.25 OR 0.5 MG/DOSE,) 2 MG/3ML SOPN 0,25mg  once a week for 4 weeks then 0.5mg  once a week for 4 weeks then call me Subcutaneous for 30 days     sertraline (ZOLOFT) 50 MG tablet Take 50 mg by mouth every morning.     No current facility-administered medications for this visit.    Allergies  Allergen Reactions   Clindamycin/Lincomycin Rash   Amoxicillin-Pot Clavulanate Other (See Comments)    Bad yeast infection   Ultram [Tramadol Hcl] Nausea And Vomiting

## 2022-12-13 NOTE — Progress Notes (Signed)
ASA 3 due to BMI.   No metformin day of procedure.  Needs to hold Ozempic one week prior.

## 2022-12-13 NOTE — Progress Notes (Signed)
LMOVM to call back 

## 2022-12-20 ENCOUNTER — Telehealth (INDEPENDENT_AMBULATORY_CARE_PROVIDER_SITE_OTHER): Payer: Self-pay | Admitting: *Deleted

## 2022-12-20 ENCOUNTER — Encounter: Payer: Self-pay | Admitting: *Deleted

## 2022-12-20 ENCOUNTER — Other Ambulatory Visit: Payer: Self-pay | Admitting: *Deleted

## 2022-12-20 MED ORDER — PEG 3350-KCL-NA BICARB-NACL 420 G PO SOLR: 4000.0000 mL | Freq: Once | ORAL | 0 refills | Status: AC

## 2022-12-20 NOTE — Telephone Encounter (Signed)
LMTRC

## 2022-12-20 NOTE — Progress Notes (Signed)
Pt has been scheduled for 01/14/23 with Dr.Carver. Instructions mailed and prep sent to the pharmacy

## 2022-12-20 NOTE — Telephone Encounter (Signed)
Patient left message she needs to reschedule her TCS for first part of April please call [phone]

## 2022-12-21 ENCOUNTER — Encounter: Payer: Self-pay | Admitting: *Deleted

## 2022-12-21 NOTE — Telephone Encounter (Signed)
LMTRC

## 2022-12-24 ENCOUNTER — Encounter: Payer: Self-pay | Admitting: *Deleted

## 2022-12-24 NOTE — Telephone Encounter (Signed)
Pt has been scheduled for 01/14/23 with Dr.Carver. Instructions mailed and prep sent to the pharmacy

## 2023-01-08 NOTE — Patient Instructions (Signed)
Maria Vargas  01/08/2023     '@PREFPERIOPPHARMACY'$ @   Your procedure is scheduled on  01/14/2023.   Report to [facility] at  [email or web address]   Call this number if you have problems the morning of surgery:  [phone]  If you experience any cold or flu symptoms such as cough, fever, chills, shortness of breath, etc. between now and your scheduled surgery, please notify us at the above number.   Remember:  Follow the diet and prep instructions given to you by the office.         Your last dose of Ozempic should have been 01/06/2023 or before.      DO NOT take any medications for diabetes the morning of your procedure.    Take these medicines the morning of surgery with A SIP OF WATER               claritin, meloxicam (if needed), zoloft.     Do not wear jewelry, make-up or nail polish.  Do not wear lotions, powders, or perfumes, or deodorant.  Do not shave 48 hours prior to surgery.  Men may shave face and neck.  Do not bring valuables to the hospital.  [facility] is not responsible for any belongings or valuables.  Contacts, dentures or bridgework may not be worn into surgery.  Leave your suitcase in the car.  After surgery it may be brought to your room.  For patients admitted to the hospital, discharge time will be determined by your treatment team.  Patients discharged the day of surgery will not be allowed to drive home and must have someone with them for 24 hours.    Special instructions:   DO NOT smoke tobacco or vape for 24 hours before your procedure.   Please read over the following fact sheets that you were given. Anesthesia Post-op Instructions and Care and Recovery After Surgery      Colonoscopy, Adult, Care After The following information offers guidance on how to care for yourself after your procedure. Your health care provider may also give you more specific instructions. If you have problems or questions, contact your health care  provider. What can I expect after the procedure? After the procedure, it is common to have: A small amount of blood in your stool for 24 hours after the procedure. Some gas. Mild cramping or bloating of your abdomen. Follow these instructions at home: Eating and drinking  Drink enough fluid to keep your urine pale yellow. Follow instructions from your health care provider about eating or drinking restrictions. Resume your normal diet as told by your health care provider. Avoid heavy or fried foods that are hard to digest. Activity Rest as told by your health care provider. Avoid sitting for a long time without moving. Get up to take short walks every 1-2 hours. This is important to improve blood flow and breathing. Ask for help if you feel weak or unsteady. Return to your normal activities as told by your health care provider. Ask your health care provider what activities are safe for you. Managing cramping and bloating  Try walking around when you have cramps or feel bloated. If directed, apply heat to your abdomen as told by your health care provider. Use the heat source that your health care provider recommends, such as a moist heat pack or a heating pad. Place a towel between your skin and the heat source. Leave the heat on for 20-30  minutes. Remove the heat if your skin turns bright red. This is especially important if you are unable to feel pain, heat, or cold. You have a greater risk of getting burned. General instructions If you were given a sedative during the procedure, it can affect you for several hours. Do not drive or operate machinery until your health care provider says that it is safe. For the first 24 hours after the procedure: Do not sign important documents. Do not drink alcohol. Do your regular daily activities at a slower pace than normal. Eat soft foods that are easy to digest. Take over-the-counter and prescription medicines only as told by your health care  provider. Keep all follow-up visits. This is important. Contact a health care provider if: You have blood in your stool 2-3 days after the procedure. Get help right away if: You have more than a small spotting of blood in your stool. You have large blood clots in your stool. You have swelling of your abdomen. You have nausea or vomiting. You have a fever. You have increasing pain in your abdomen that is not relieved with medicine. These symptoms may be an emergency. Get help right away. Call 911. Do not wait to see if the symptoms will go away. Do not drive yourself to the hospital. Summary After the procedure, it is common to have a small amount of blood in your stool. You may also have mild cramping and bloating of your abdomen. If you were given a sedative during the procedure, it can affect you for several hours. Do not drive or operate machinery until your health care provider says that it is safe. Get help right away if you have a lot of blood in your stool, nausea or vomiting, a fever, or increased pain in your abdomen. This information is not intended to replace advice given to you by your health care provider. Make sure you discuss any questions you have with your health care provider. Document Revised: 06/21/2021 Document Reviewed: 06/21/2021 Elsevier Patient Education  [place] After The following information offers guidance on how to care for yourself after your procedure. Your health care provider may also give you more specific instructions. If you have problems or questions, contact your health care provider. What can I expect after the procedure? After the procedure, it is common to have: Tiredness. Little or no memory about what happened during or after the procedure. Impaired judgment when it comes to making decisions. Nausea or vomiting. Some trouble with balance. Follow these instructions at home: For the time period you  were told by your health care provider:  Rest. Do not participate in activities where you could fall or become injured. Do not drive or use machinery. Do not drink alcohol. Do not take sleeping pills or medicines that cause drowsiness. Do not make important decisions or sign legal documents. Do not take care of children on your own. Medicines Take over-the-counter and prescription medicines only as told by your health care provider. If you were prescribed antibiotics, take them as told by your health care provider. Do not stop using the antibiotic even if you start to feel better. Eating and drinking Follow instructions from your health care provider about what you may eat and drink. Drink enough fluid to keep your urine pale yellow. If you vomit: Drink clear fluids slowly and in small amounts as you are able. Clear fluids include water, ice chips, low-calorie sports drinks, and fruit juice that has  water added to it (diluted fruit juice). Eat light and bland foods in small amounts as you are able. These foods include bananas, applesauce, rice, lean meats, toast, and crackers. General instructions  Have a responsible adult stay with you for the time you are told. It is important to have someone help care for you until you are awake and alert. If you have sleep apnea, surgery and some medicines can increase your risk for breathing problems. Follow instructions from your health care provider about wearing your sleep device: When you are sleeping. This includes during daytime naps. While taking prescription pain medicines, sleeping medicines, or medicines that make you drowsy. Do not use any products that contain nicotine or tobacco. These products include cigarettes, chewing tobacco, and vaping devices, such as e-cigarettes. If you need help quitting, ask your health care provider. Contact a health care provider if: You feel nauseous or vomit every time you eat or drink. You feel  light-headed. You are still sleepy or having trouble with balance after 24 hours. You get a rash. You have a fever. You have redness or swelling around the IV site. Get help right away if: You have trouble breathing. You have new confusion after you get home. These symptoms may be an emergency. Get help right away. Call 911. Do not wait to see if the symptoms will go away. Do not drive yourself to the hospital. This information is not intended to replace advice given to you by your health care provider. Make sure you discuss any questions you have with your health care provider. Document Revised: 03/26/2022 Document Reviewed: 03/26/2022 Elsevier Patient Education  [place].

## 2023-01-09 ENCOUNTER — Encounter (HOSPITAL_COMMUNITY)
Admission: RE | Admit: 2023-01-09 | Discharge: 2023-01-09 | Disposition: A | Discharge status: Home / Self Care | Payer: Medicaid Other | Source: Ambulatory Visit | Attending: Internal Medicine | Admitting: Internal Medicine

## 2023-01-09 ENCOUNTER — Telehealth (INDEPENDENT_AMBULATORY_CARE_PROVIDER_SITE_OTHER): Payer: Self-pay | Admitting: *Deleted

## 2023-01-09 ENCOUNTER — Encounter (HOSPITAL_COMMUNITY): Payer: Self-pay

## 2023-01-09 DIAGNOSIS — I10 Essential (primary) hypertension: Secondary | ICD-10-CM

## 2023-01-09 DIAGNOSIS — E119 Type 2 diabetes mellitus without complications: Secondary | ICD-10-CM

## 2023-01-09 NOTE — Pre-Procedure Instructions (Signed)
Office messaged because patient did not show for her pre-op.

## 2023-01-09 NOTE — Telephone Encounter (Signed)
Called pt, LMOVM to call back. 

## 2023-01-09 NOTE — Telephone Encounter (Signed)
-----   Message from Encarnacion Chu, RN sent at 01/09/2023  9:44 AM EST ----- Regarding: no show Hello! Maria Vargas did not shoe for her pre-op this morning.

## 2023-01-14 ENCOUNTER — Encounter: Payer: Self-pay | Admitting: *Deleted

## 2023-01-14 ENCOUNTER — Ambulatory Visit (HOSPITAL_COMMUNITY): Admission: RE | Admit: 2023-01-14 | Payer: Medicaid Other | Source: Ambulatory Visit

## 2023-01-14 ENCOUNTER — Telehealth (INDEPENDENT_AMBULATORY_CARE_PROVIDER_SITE_OTHER): Payer: Self-pay | Admitting: *Deleted

## 2023-01-14 ENCOUNTER — Encounter (HOSPITAL_COMMUNITY): Admission: RE | Payer: Self-pay | Source: Ambulatory Visit

## 2023-01-14 SURGERY — COLONOSCOPY WITH PROPOFOL
Anesthesia: Monitor Anesthesia Care

## 2023-01-14 NOTE — Telephone Encounter (Signed)
NS letter for procedure has been sent to patient's PCP

## 2023-01-14 NOTE — Telephone Encounter (Signed)
Received message patient was a no show for procedure. See prior note was also no show for pre-op. She was a triage. Maria Vargas

## 2023-04-19 NOTE — Therapy (Signed)
OUTPATIENT PHYSICAL THERAPY THORACOLUMBAR EVALUATION   Patient Name: Maria Vargas MRN: [id number] DOB:May 07, 1966, 57 y.o., female Today's Date: 04/19/2023  END OF SESSION:   Past Medical History:  Diagnosis Date   Anxiety    Asthma    Depression    Diabetes mellitus without complication (HCC)    Hypertension    Thyroid disease    Tobacco use    Past Surgical History:  Procedure Laterality Date   CESAREAN SECTION     CYSTOSCOPY W/ DILATION OF BLADDER     x2   ENDOMETRIAL ABLATION     HYSTEROSCOPY     TONSILLECTOMY     Patient Active Problem List   Diagnosis Date Noted   Cellulitis of abdominal wall 11/19/2018   Cellulitis of pubic region 06/18/2018   Hypertension 06/18/2018   Depression with anxiety 06/18/2018   Type 2 diabetes mellitus (HCC) 06/18/2018   Cellulitis 03/22/2018   Hypothyroidism 03/22/2018   Cellulitis and abscess of leg     PCP: Alvina Filbert, MD  REFERRING PROVIDER: Alvina Filbert, MD  REFERRING DIAG:  Diagnosis  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  ONSET DATE: ***  SUBJECTIVE:                                                                                                                                                                                           SUBJECTIVE STATEMENT: ***  PERTINENT HISTORY:  ***  PAIN:  Are you having pain? {OPRCPAIN:27236}  PRECAUTIONS: {Therapy precautions:24002}  WEIGHT BEARING RESTRICTIONS: {Yes ***/No:24003}  FALLS:  Has patient fallen in last 6 months? {fallsyesno:27318}  LIVING ENVIRONMENT: Lives with: {OPRC lives with:25569::"lives with their family"} Lives in: {Lives in:25570} Stairs: {opstairs:27293} Has following equipment at home: {Assistive devices:23999}  OCCUPATION: ***  PLOF: {PLOF:24004}  PATIENT GOALS: ***  NEXT MD VISIT: ***  OBJECTIVE:    DIAGNOSTIC FINDINGS:  ***  PATIENT SURVEYS:  {rehab surveys:24030}  SCREENING FOR RED FLAGS: Bowel or bladder incontinence: {Yes/No:304960894} Spinal tumors: {Yes/No:304960894} Cauda equina syndrome: {Yes/No:304960894} Compression fracture: {Yes/No:304960894} Abdominal aneurysm: {Yes/No:304960894}  COGNITION: Overall cognitive status: {cognition:24006}     SENSATION: {sensation:27233}  MUSCLE LENGTH: Hamstrings: Right *** deg; Left *** deg Thomas test: Right *** deg; Left *** deg  POSTURE: {posture:25561}  PALPATION: ***  LUMBAR ROM:   AROM eval  Flexion   Extension   Right lateral flexion   Left lateral flexion   Right rotation   Left rotation    (Blank rows = not tested)  LOWER EXTREMITY ROM:     {AROM/PROM:27142}  Right  eval Left eval  Hip flexion    Hip extension    Hip abduction    Hip adduction    Hip internal rotation    Hip external rotation    Knee flexion    Knee extension    Ankle dorsiflexion    Ankle plantarflexion    Ankle inversion    Ankle eversion     (Blank rows = not tested)  LOWER EXTREMITY MMT:    MMT Right eval Left eval  Hip flexion    Hip extension    Hip abduction    Hip adduction    Hip internal rotation    Hip external rotation    Knee flexion    Knee extension    Ankle dorsiflexion    Ankle plantarflexion    Ankle inversion    Ankle eversion     (Blank rows = not tested)  LUMBAR SPECIAL TESTS:  {lumbar special test:25242}  FUNCTIONAL TESTS:  {Functional tests:24029}  GAIT: Distance walked: *** Assistive device utilized: {Assistive devices:23999} Level of assistance: {Levels of assistance:24026} Comments: ***  TODAY'S TREATMENT:                                                                                                                              DATE: ***    PATIENT EDUCATION:  Education details: *** Person educated: {Person educated:25204} Education method: {Education  Method:25205} Education comprehension: {Education Comprehension:25206}  HOME EXERCISE PROGRAM: ***  ASSESSMENT:  CLINICAL IMPRESSION: Patient is a *** y.o. *** who was seen today for physical therapy evaluation and treatment for ***.   OBJECTIVE IMPAIRMENTS: {opptimpairments:25111}.   ACTIVITY LIMITATIONS: {activitylimitations:27494}  PARTICIPATION LIMITATIONS: {participationrestrictions:25113}  PERSONAL FACTORS: {Personal factors:25162} are also affecting patient's functional outcome.   REHAB POTENTIAL: {rehabpotential:25112}  CLINICAL DECISION MAKING: {clinical decision making:25114}  EVALUATION COMPLEXITY: {Evaluation complexity:25115}   GOALS: Goals reviewed with patient? {yes/no:20286}  SHORT TERM GOALS: Target date: ***  *** Baseline: Goal status: {GOALSTATUS:25110}  2.  *** Baseline:  Goal status: {GOALSTATUS:25110}  3.  *** Baseline:  Goal status: {GOALSTATUS:25110}  4.  *** Baseline:  Goal status: {GOALSTATUS:25110}  5.  *** Baseline:  Goal status: {GOALSTATUS:25110}  6.  *** Baseline:  Goal status: {GOALSTATUS:25110}  LONG TERM GOALS: Target date: ***  *** Baseline:  Goal status: {GOALSTATUS:25110}  2.  *** Baseline:  Goal status: {GOALSTATUS:25110}  3.  *** Baseline:  Goal status: {GOALSTATUS:25110}  4.  *** Baseline:  Goal status: {GOALSTATUS:25110}  5.  *** Baseline:  Goal status: {GOALSTATUS:25110}  6.  *** Baseline:  Goal status: {GOALSTATUS:25110}  PLAN:  PT FREQUENCY: {rehab frequency:25116}  PT DURATION: {rehab duration:25117}  PLANNED INTERVENTIONS: Therapeutic exercises, Therapeutic activity, Neuromuscular re-education, Balance training, Gait training, Patient/Family education, Joint manipulation, Joint mobilization, Stair training, Orthotic/Fit training, DME instructions, Aquatic Therapy, Dry Needling, Electrical stimulation, Spinal manipulation, Spinal mobilization, Cryotherapy, Moist heat, Compression  bandaging, scar mobilization, Splintting, Taping, Traction, Ultrasound, Ionotophoresis 4mg /ml Dexamethasone, and Manual therapy .  PLAN  FOR NEXT SESSION: Review HEP and goals;  10:39 AM, 04/19/23 Keala Drum Small Solenne Manwarren MPT [facility] [place] [phone]

## 2023-04-22 ENCOUNTER — Other Ambulatory Visit: Payer: Self-pay

## 2023-04-22 ENCOUNTER — Ambulatory Visit (HOSPITAL_COMMUNITY): Payer: Medicaid Other | Attending: Internal Medicine

## 2023-04-22 DIAGNOSIS — M159 Polyosteoarthritis, unspecified: Secondary | ICD-10-CM | POA: Diagnosis present

## 2023-04-22 DIAGNOSIS — M25562 Pain in left knee: Secondary | ICD-10-CM | POA: Diagnosis present

## 2023-04-22 DIAGNOSIS — M25561 Pain in right knee: Secondary | ICD-10-CM | POA: Diagnosis present

## 2023-04-22 DIAGNOSIS — R262 Difficulty in walking, not elsewhere classified: Secondary | ICD-10-CM | POA: Diagnosis present

## 2023-04-22 DIAGNOSIS — G8929 Other chronic pain: Secondary | ICD-10-CM | POA: Insufficient documentation

## 2023-04-26 ENCOUNTER — Encounter (HOSPITAL_COMMUNITY): Payer: Medicaid Other

## 2023-04-30 ENCOUNTER — Ambulatory Visit (HOSPITAL_COMMUNITY): Payer: Medicaid Other

## 2023-04-30 ENCOUNTER — Encounter (HOSPITAL_COMMUNITY): Payer: Self-pay

## 2023-04-30 DIAGNOSIS — M159 Polyosteoarthritis, unspecified: Secondary | ICD-10-CM | POA: Diagnosis not present

## 2023-04-30 DIAGNOSIS — G8929 Other chronic pain: Secondary | ICD-10-CM

## 2023-04-30 DIAGNOSIS — R262 Difficulty in walking, not elsewhere classified: Secondary | ICD-10-CM

## 2023-04-30 NOTE — Therapy (Signed)
OUTPATIENT PHYSICAL THERAPY THORACOLUMBAR TREATMENT   Patient Name: Maria Vargas MRN: [id number] DOB:October 11, 1966, 57 y.o., female Today's Date: 04/30/2023  END OF SESSION:  PT End of Session - 04/30/23 1229     Visit Number 2    Number of Visits 6    Date for PT Re-Evaluation 06/03/23    Authorization Type [employer]    Authorization Time Period healthy blue approved 7 visits from 04/22/2023-06/20/2023    Authorization - Visit Number 1    Authorization - Number of Visits 7    Progress Note Due on Visit 7    PT Start Time 1143   Late arrival   PT Stop Time 1212    PT Time Calculation (min) 29 min    Activity Tolerance Patient tolerated treatment well    Behavior During Therapy WFL for tasks assessed/performed              Past Medical History:  Diagnosis Date   Anxiety    Asthma    Depression    Diabetes mellitus without complication (HCC)    Hypertension    Thyroid disease    Tobacco use    Past Surgical History:  Procedure Laterality Date   CESAREAN SECTION     CYSTOSCOPY W/ DILATION OF BLADDER     x2   ENDOMETRIAL ABLATION     HYSTEROSCOPY     TONSILLECTOMY     Patient Active Problem List   Diagnosis Date Noted   Cellulitis of abdominal wall 11/19/2018   Cellulitis of pubic region 06/18/2018   Hypertension 06/18/2018   Depression with anxiety 06/18/2018   Type 2 diabetes mellitus (HCC) 06/18/2018   Cellulitis 03/22/2018   Hypothyroidism 03/22/2018   Cellulitis and abscess of leg     PCP: Alvina Filbert, MD  REFERRING PROVIDER: Alvina Filbert, MD  REFERRING DIAG:  Diagnosis  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  Pt reports she has constant pain in her knees, pain scale 4/10.  Reports she has been unable to complete HEP as woke up with cramp Lt calf that lasted  for 2 days as well as pain on shoulder blade to lower back.    Eval:  Patient reports ongoing arthritis; recently able to get insurance so when she started to have increased pain and difficulty and so MD referred to therapy; most of her pain is in her knees; trouble walking; takes care of her son and has grandchildren that live next door she helps with.   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 Yes: NPRS scale: today 4/10 Pain location: mostly hips and knees and legs Pain description: aching and sore Aggravating factors: prolonged standing and walking; prolonged sitting Relieving factors: injections helped some, ibuprofen, meloxicam  PRECAUTIONS: Fall  WEIGHT BEARING RESTRICTIONS: No  FALLS:  Has patient fallen in last 6 months? Yes. Number of falls 3  LIVING ENVIRONMENT: Lives with: lives with their son disables son she takes care of Lives in: House/apartment Stairs: Yes: Internal: 13 steps; on right going up, on left going up, and can reach both and External: 7 steps; on right going up, on left going up, and can reach both Has following equipment at home: None  OCCUPATION: disabled  PLOF: Independent with basic ADLs  PATIENT GOALS: keep up better with my grandchildren  NEXT MD VISIT: July  OBJECTIVE:   DIAGNOSTIC FINDINGS:  None since 2020  PATIENT SURVEYS:  LEFS 25/80 31.3 %   COGNITION: Overall cognitive status: Within functional limits for tasks assessed     SENSATION: Pins and needles in feet due to neuropathy  POSTURE: rounded shoulders  and forward head  PALPATION: Soreness in knees  LUMBAR ROM:   AROM eval  Flexion 65% available pulling  Extension 40% available  Right lateral flexion   Left lateral flexion   Right rotation   Left rotation    (Blank rows = not tested)  LOWER EXTREMITY ROM:     Active  Right eval Left eval  Hip flexion    Hip extension    Hip abduction    Hip adduction    Hip internal rotation    Hip external rotation    Knee flexion 118 124  Knee extension    Ankle dorsiflexion    Ankle plantarflexion    Ankle inversion    Ankle eversion     (Blank rows = not tested)  LOWER EXTREMITY MMT:    MMT Right eval Left eval  Hip flexion 4 4-  Hip extension 3+ 3+  Hip abduction 4 4  Hip adduction    Hip internal rotation    Hip external rotation    Knee flexion 4 4  Knee extension 4+ 4  Ankle dorsiflexion 4+ 4+  Ankle plantarflexion    Ankle inversion    Ankle eversion     (Blank rows = not tested)   FUNCTIONAL TESTS:  5 times sit to stand: 25.36 sec using hands to push up on knees; audible popping in knees   GAIT: Distance walked: 50 ft in clinic Assistive device utilized: None Level of assistance: Modified independence Comments: slow antalgic gait  TODAY'S TREATMENT:  DATE:  04/30/23: Reviewed goals Educated importance of HEP compliance Pt reports compliance with HEP without questions  Sidelying: abduction 10x Prone: Hip extension 10x Knee flexion 10x Supine: bridge 10x Quad sets 10x BLE- c/o Lt patella pain during exercise Manual patella mobs all directions SLR 10x   STS 10x eccentric control   04/22/23 physical therapy evaluation and HEP instruction    PATIENT EDUCATION:  Education details: Patient educated on exam findings, POC, scope of PT, HEP, and what to expect next visit; benefits of aquatic exercise Person educated:  Patient Education method: Explanation, Demonstration, and Handouts Education comprehension: verbalized understanding, returned demonstration, verbal cues required, and tactile cues required  HOME EXERCISE PROGRAM: Access Code: [email or web address] URL: [email or web address] Date: 04/22/2023 Prepared by: [facility] - Rehab  Exercises - Supine Bridge  - 2 x daily - 7 x weekly - 1 sets - 10 reps - Supine Quad Set  - 2 x daily - 7 x weekly - 1 sets - 10 reps - Supine Lower Trunk Rotation  - 2 x daily - 7 x weekly - 1 sets - 10 reps  -Prone hip extension - STS  ASSESSMENT:  CLINICAL IMPRESSION: 04/30/23:  Reviewed goals and educated importance of HEP compliance for maximal benefits.  Pt able to recall and demonstrate appropriate mechanics with current exercise program without questions or cueing required.  Session focus with LE strengthening exercises.  Pt able to complete all exercises with good form and control.  Added prone hip extension and STS to HEP with printout given and verbalized understanding.  Pt coming 1x week, advance HEP every week.    Eval:  Patient is a 57 y.o. female who was seen today for physical therapy evaluation and treatment for polyarthritis. Patient demonstrates muscle weakness, reduced ROM, and fascial restrictions which are likely contributing to symptoms of pain and are negatively impacting patient ability to perform ADLs and functional mobility tasks. Patient will benefit from skilled physical therapy services to address these deficits to reduce pain and improve level of function with ADLs and functional mobility tasks.   OBJECTIVE IMPAIRMENTS: Abnormal gait, decreased activity tolerance, decreased endurance, decreased mobility, difficulty walking, decreased ROM, decreased strength, increased fascial restrictions, impaired perceived functional ability, impaired flexibility, and pain.   ACTIVITY LIMITATIONS: carrying, lifting, bending, sitting, standing, squatting,  sleeping, stairs, transfers, bed mobility, bathing, dressing, locomotion level, and caring for others  PARTICIPATION LIMITATIONS: meal prep, cleaning, laundry, shopping, community activity, and yard work   [facility] POTENTIAL: Good  CLINICAL DECISION MAKING: Stable/uncomplicated  EVALUATION COMPLEXITY: Low   GOALS: Goals reviewed with patient? No  SHORT TERM GOALS: Target date: 05/13/2023  patient will be independent with initial HEP  Baseline: Goal status: IN PROGRESS  2.  Patient will self report 30% improvement to improve tolerance for functional activity  Baseline:  Goal status: IN PROGRESS    LONG TERM GOALS: Target date: 04/22/2023  Patient will be independent in self management strategies to improve quality of life and functional outcomes.  Baseline:  Goal status: IN PROGRESS  2.  Patient will self report 50% improvement to improve tolerance for functional activity  Baseline:  Goal status: IN PROGRESS  3.  Patient will increase bilateral  leg MMTs to 4+ to 5/5 without pain to promote return to ambulation community distances with minimal deviation.    Baseline: see above Goal status: IN PROGRESS  4.  Patient will improve/increase LEFS score by 10 points to demonstrate improved perceived functional mobility Baseline: 25/80 Goal status: IN PROGRESS  5.  Patient will improve 5 times sit to stand score from 25.36 sec to 20 sec to demonstrate improved functional mobility and increased lower extremity strength.  Baseline:  Goal status: IN PROGRESS   PLAN:  PT FREQUENCY: 1x/week  PT DURATION: 6 weeks  PLANNED INTERVENTIONS: Therapeutic exercises, Therapeutic activity, Neuromuscular re-education, Balance training, Gait training, Patient/Family education, Joint manipulation, Joint mobilization, Stair training, Orthotic/Fit training, DME instructions, Aquatic Therapy, Dry Needling, Electrical stimulation, Spinal manipulation, Spinal mobilization, Cryotherapy,  Moist heat, Compression bandaging, scar mobilization, Splintting, Taping, Traction, Ultrasound, Ionotophoresis 4mg /ml Dexamethasone, and Manual therapy .  PLAN FOR NEXT SESSION: Progress functional strength; core strength; functional mobility and balance.  Begin squats, heel raises and step up training next session.  Becky Sax, LPTA/CLT; CBIS [phone] Juel Burrow, PTA 04/30/2023, 12:38 PM  12:38 PM, 04/30/23

## 2023-05-08 ENCOUNTER — Encounter (HOSPITAL_COMMUNITY): Payer: Medicaid Other | Admitting: Physical Therapy

## 2023-05-14 ENCOUNTER — Encounter (HOSPITAL_COMMUNITY): Payer: Medicaid Other

## 2023-05-21 ENCOUNTER — Encounter (HOSPITAL_COMMUNITY): Payer: Medicaid Other | Admitting: Physical Therapy

## 2023-05-27 ENCOUNTER — Ambulatory Visit (HOSPITAL_COMMUNITY): Payer: Medicaid Other | Attending: Internal Medicine

## 2023-05-27 DIAGNOSIS — M25561 Pain in right knee: Secondary | ICD-10-CM | POA: Insufficient documentation

## 2023-05-27 DIAGNOSIS — R262 Difficulty in walking, not elsewhere classified: Secondary | ICD-10-CM | POA: Insufficient documentation

## 2023-05-27 DIAGNOSIS — M159 Polyosteoarthritis, unspecified: Secondary | ICD-10-CM | POA: Insufficient documentation

## 2023-05-27 DIAGNOSIS — M25562 Pain in left knee: Secondary | ICD-10-CM | POA: Insufficient documentation

## 2023-05-27 DIAGNOSIS — G8929 Other chronic pain: Secondary | ICD-10-CM | POA: Diagnosis present

## 2023-05-27 NOTE — Therapy (Signed)
OUTPATIENT PHYSICAL THERAPY THORACOLUMBAR TREATMENT   Patient Name: Maria Vargas MRN: [id number] DOB:[birth year], 57 y.o., female Today's Date: 05/27/2023  END OF SESSION:  PT End of Session - 05/27/23 0944     Visit Number 3    Number of Visits 6    Date for PT Re-Evaluation 06/03/23    Authorization Type [place] Medicaid Healthy Blue    Authorization Time Period healthy blue approved 7 visits from 04/22/2023-06/20/2023    Authorization - Visit Number 2    Authorization - Number of Visits 7    Progress Note Due on Visit 7    PT Start Time 0944    PT Stop Time 1025    PT Time Calculation (min) 41 min    Activity Tolerance Patient tolerated treatment well    Behavior During Therapy WFL for tasks assessed/performed              Past Medical History:  Diagnosis Date   Anxiety    Asthma    Depression    Diabetes mellitus without complication (HCC)    Hypertension    Thyroid disease    Tobacco use    Past Surgical History:  Procedure Laterality Date   CESAREAN SECTION     CYSTOSCOPY W/ DILATION OF BLADDER     x2   ENDOMETRIAL ABLATION     HYSTEROSCOPY     TONSILLECTOMY     Patient Active Problem List   Diagnosis Date Noted   Cellulitis of abdominal wall 11/19/2018   Cellulitis of pubic region 06/18/2018   Hypertension 06/18/2018   Depression with anxiety 06/18/2018   Type 2 diabetes mellitus (HCC) 06/18/2018   Cellulitis 03/22/2018   Hypothyroidism 03/22/2018   Cellulitis and abscess of leg     PCP: Alvina Filbert, MD  REFERRING PROVIDER: Alvina Filbert, MD  REFERRING DIAG:  Diagnosis  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, unspecified    Rationale for Evaluation and Treatment: Rehabilitation  THERAPY DIAG:  Osteoarthritis of multiple joints, unspecified osteoarthritis type  Difficulty in walking, not elsewhere classified  Chronic pain of both knees  ONSET DATE: chronic; many years; since my 50's  SUBJECTIVE:                                                                                                                                                                                            SUBJECTIVE STATEMENT: Patient states she has not been able to attend as she takes her daughter in law and son to various appointments as they cannot drive.  She is also assists in the care of their 5 grandchildren.  She fell 7/9 onto her  left knee and broke the skin.  Left knee is still sore.  Has tried to do her HEP but she keeps her 5 grandkids daily.    Eval:  Patient reports ongoing arthritis; recently able to get insurance so when she started to have increased pain and difficulty and so MD referred to therapy; most of her pain is in her knees; trouble walking; takes care of her son and has grandchildren that live next door she helps with.   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 Yes: NPRS scale: today 4/10 Pain location: mostly hips and knees and legs Pain description: aching and sore Aggravating factors: prolonged standing and walking; prolonged sitting Relieving factors: injections helped some, ibuprofen, meloxicam  PRECAUTIONS: Fall  WEIGHT BEARING RESTRICTIONS: No  FALLS:  Has patient fallen in last 6 months? Yes. Number of falls 3  LIVING ENVIRONMENT: Lives with: lives with their son disables son she takes care of Lives in: House/apartment Stairs: Yes: Internal: 13 steps; on right going up, on left going up, and can reach both and External: 7 steps; on right going up, on left going up, and can reach both Has following equipment at home: None  OCCUPATION: disabled  PLOF: Independent with basic ADLs  PATIENT GOALS: keep up better with my grandchildren  NEXT MD VISIT: July  OBJECTIVE:   DIAGNOSTIC FINDINGS:  None since 2020  PATIENT SURVEYS:  LEFS 25/80 31.3 %   COGNITION: Overall cognitive status: Within functional limits for tasks assessed     SENSATION: Pins  and needles in feet due to neuropathy  POSTURE: rounded shoulders and forward head  PALPATION: Soreness in knees  LUMBAR ROM:   AROM eval  Flexion 65% available pulling  Extension 40% available  Right lateral flexion   Left lateral flexion   Right rotation   Left rotation    (Blank rows = not tested)  LOWER EXTREMITY ROM:     Active  Right eval Left eval  Hip flexion    Hip extension    Hip abduction    Hip adduction    Hip internal rotation    Hip external rotation    Knee flexion 118 124  Knee extension    Ankle dorsiflexion    Ankle plantarflexion    Ankle inversion    Ankle eversion     (Blank rows = not tested)  LOWER EXTREMITY MMT:    MMT Right eval Left eval  Hip flexion 4 4-  Hip extension 3+ 3+  Hip abduction 4 4  Hip adduction    Hip internal rotation    Hip external rotation    Knee flexion 4 4  Knee extension 4+ 4  Ankle dorsiflexion 4+ 4+  Ankle plantarflexion    Ankle inversion    Ankle eversion     (Blank rows = not tested)   FUNCTIONAL TESTS:  5 times sit to stand: 25.36 sec using hands to push up on knees; audible popping in knees   GAIT: Distance walked: 50 ft in clinic Assistive device utilized: None Level of assistance: Modified independence Comments: slow antalgic gait  TODAY'S TREATMENT:  DATE:  05/27/23 Nustep x 5' dynamic warm up; seat 5;arms 5  Standing: Heel/toe raises x 20 Hip abduction 2 x 10 Hip extension 2 x 10 4" step ups 2 x 10 each with 1 UE assist Tandem stance 2 x 30" each  Sit to stand x 10 with no UE assist  Sitting: SAQ's 2# 2 x 10 Hamstring stretch with strap 10 x 10"   04/30/23: Reviewed goals Educated importance of HEP compliance Pt reports compliance with HEP without questions  Sidelying: abduction 10x Prone: Hip extension 10x Knee flexion 10x Supine: bridge  10x Quad sets 10x BLE- c/o Lt patella pain during exercise Manual patella mobs all directions SLR 10x   STS 10x eccentric control   04/22/23 physical therapy evaluation and HEP instruction    PATIENT EDUCATION:  Education details: Patient educated on exam findings, POC, scope of PT, HEP, and what to expect next visit; benefits of aquatic exercise Person educated: Patient Education method: Explanation, Demonstration, and Handouts Education comprehension: verbalized understanding, returned demonstration, verbal cues required, and tactile cues required  HOME EXERCISE PROGRAM: 05/27/23 standing heel/toe raises, hip abduction and hip extension Access Code: [email or web address] URL: [email or web address] Date: 04/22/2023 Prepared by: [facility] - Rehab  Exercises - Supine Bridge  - 2 x daily - 7 x weekly - 1 sets - 10 reps - Supine Quad Set  - 2 x daily - 7 x weekly - 1 sets - 10 reps - Supine Lower Trunk Rotation  - 2 x daily - 7 x weekly - 1 sets - 10 reps  -Prone hip extension - STS  ASSESSMENT:  CLINICAL IMPRESSION: Patient has missed a few appointments  due to family commitments.  Progressed to standing exercises today and updated HEP. Patient reports some pain in the left knee with step ups today but otherwise no increased pain compliant. Added tandem stance to challenge balance and patient can perform with only occasional use of hands.   Patient will benefit from continued skilled therapy services to address deficits and promote return to optimal function.       Eval:  Patient is a 57 y.o. female who was seen today for physical therapy evaluation and treatment for polyarthritis. Patient demonstrates muscle weakness, reduced ROM, and fascial restrictions which are likely contributing to symptoms of pain and are negatively impacting patient ability to perform ADLs and functional mobility tasks. Patient will benefit from skilled physical therapy services to address these deficits to  reduce pain and improve level of function with ADLs and functional mobility tasks.   OBJECTIVE IMPAIRMENTS: Abnormal gait, decreased activity tolerance, decreased endurance, decreased mobility, difficulty walking, decreased ROM, decreased strength, increased fascial restrictions, impaired perceived functional ability, impaired flexibility, and pain.   ACTIVITY LIMITATIONS: carrying, lifting, bending, sitting, standing, squatting, sleeping, stairs, transfers, bed mobility, bathing, dressing, locomotion level, and caring for others  PARTICIPATION LIMITATIONS: meal prep, cleaning, laundry, shopping, community activity, and yard work   [facility] POTENTIAL: Good  CLINICAL DECISION MAKING: Stable/uncomplicated  EVALUATION COMPLEXITY: Low   GOALS: Goals reviewed with patient? No  SHORT TERM GOALS: Target date: 05/13/2023  patient will be independent with initial HEP  Baseline: Goal status: IN PROGRESS  2.  Patient will self report 30% improvement to improve tolerance for functional activity  Baseline:  Goal status: IN PROGRESS    LONG TERM GOALS: Target date: 06/03/2023  Patient will be independent in self management strategies to improve quality of life and functional outcomes.  Baseline:  Goal status: IN PROGRESS  2.  Patient will self report 50% improvement to improve tolerance for functional activity  Baseline:  Goal status: IN PROGRESS  3.  Patient will increase bilateral  leg MMTs to 4+ to 5/5 without pain to promote return to ambulation community distances with minimal deviation.    Baseline: see above Goal status: IN PROGRESS  4.  Patient will improve/increase LEFS score by 10 points to demonstrate improved perceived functional mobility Baseline: 25/80 Goal status: IN PROGRESS  5.  Patient will improve 5 times sit to stand score from 25.36 sec to 20 sec to demonstrate improved functional mobility and increased lower extremity strength.  Baseline:  Goal status:  IN PROGRESS   PLAN:  PT FREQUENCY: 1x/week  PT DURATION: 6 weeks  PLANNED INTERVENTIONS: Therapeutic exercises, Therapeutic activity, Neuromuscular re-education, Balance training, Gait training, Patient/Family education, Joint manipulation, Joint mobilization, Stair training, Orthotic/Fit training, DME instructions, Aquatic Therapy, Dry Needling, Electrical stimulation, Spinal manipulation, Spinal mobilization, Cryotherapy, Moist heat, Compression bandaging, scar mobilization, Splintting, Taping, Traction, Ultrasound, Ionotophoresis 4mg /ml Dexamethasone, and Manual therapy .  PLAN FOR NEXT SESSION: Progress functional strength; core strength; functional mobility and balance.   Squats; tandem stance on foam 10:25 AM, 05/27/23 [employer] [place] MPT [place] [facility] [place] [phone]

## 2023-06-03 ENCOUNTER — Ambulatory Visit (HOSPITAL_COMMUNITY): Payer: Medicaid Other

## 2023-06-03 ENCOUNTER — Encounter (HOSPITAL_COMMUNITY): Payer: Self-pay

## 2023-06-03 DIAGNOSIS — R262 Difficulty in walking, not elsewhere classified: Secondary | ICD-10-CM

## 2023-06-03 DIAGNOSIS — M159 Polyosteoarthritis, unspecified: Secondary | ICD-10-CM | POA: Diagnosis not present

## 2023-06-03 DIAGNOSIS — G8929 Other chronic pain: Secondary | ICD-10-CM

## 2023-06-03 NOTE — Therapy (Signed)
OUTPATIENT PHYSICAL THERAPY THORACOLUMBAR TREATMENT   Patient Name: Maria Vargas MRN: [id number] DOB:[birth year], 57 y.o., female Today's Date: 06/03/2023  END OF SESSION:  PT End of Session - 06/03/23 1049     Visit Number 4    Number of Visits 6    Date for PT Re-Evaluation 06/03/23    Authorization Type [place] Medicaid Healthy Blue    Authorization Time Period healthy blue approved 7 visits from 04/22/2023-06/20/2023    Authorization - Visit Number 3    Authorization - Number of Visits 7    Progress Note Due on Visit 7    PT Start Time 1050    PT Stop Time 1128    PT Time Calculation (min) 38 min    Activity Tolerance Patient tolerated treatment well    Behavior During Therapy WFL for tasks assessed/performed              Past Medical History:  Diagnosis Date   Anxiety    Asthma    Depression    Diabetes mellitus without complication (HCC)    Hypertension    Thyroid disease    Tobacco use    Past Surgical History:  Procedure Laterality Date   CESAREAN SECTION     CYSTOSCOPY W/ DILATION OF BLADDER     x2   ENDOMETRIAL ABLATION     HYSTEROSCOPY     TONSILLECTOMY     Patient Active Problem List   Diagnosis Date Noted   Cellulitis of abdominal wall 11/19/2018   Cellulitis of pubic region 06/18/2018   Hypertension 06/18/2018   Depression with anxiety 06/18/2018   Type 2 diabetes mellitus (HCC) 06/18/2018   Cellulitis 03/22/2018   Hypothyroidism 03/22/2018   Cellulitis and abscess of leg     PCP: Alvina Filbert, MD  REFERRING PROVIDER: Alvina Filbert, MD  REFERRING DIAG:  Diagnosis  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, unspecified    Rationale for Evaluation and Treatment: Rehabilitation  THERAPY DIAG:  Osteoarthritis of multiple joints, unspecified osteoarthritis type  Difficulty in walking, not elsewhere classified  Chronic pain of both knees  ONSET DATE: chronic; many years; since my 30's  SUBJECTIVE:                                                                                                                                                                                            SUBJECTIVE STATEMENT: 06/03/23:  Reports she has increased pain the last couple days, not sure if it's linked to the weather.  Reports cramping every night in both her calves.  Reports pain is the worst at night or if on feet for long periods  of time.    Eval:  Patient reports ongoing arthritis; recently able to get insurance so when she started to have increased pain and difficulty and so MD referred to therapy; most of her pain is in her knees; trouble walking; takes care of her son and has grandchildren that live next door she helps with.   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 Yes: NPRS scale: 8/10 Pain location: mostly hips and knees and legs Pain description: aching and sore Aggravating factors: prolonged standing and walking; prolonged sitting Relieving factors: injections helped some, ibuprofen, meloxicam  PRECAUTIONS: Fall  WEIGHT BEARING RESTRICTIONS: No  FALLS:  Has patient fallen in last 6 months? Yes. Number of falls 3  LIVING ENVIRONMENT: Lives with: lives with their son disables son she takes care of Lives in: House/apartment Stairs: Yes: Internal: 13 steps; on right going up, on left going up, and can reach both and External: 7 steps; on right going up, on left going up, and can reach both Has following equipment at home: None  OCCUPATION: disabled  PLOF: Independent with basic ADLs  PATIENT GOALS: keep up better with my grandchildren  NEXT MD VISIT: July  OBJECTIVE:   DIAGNOSTIC FINDINGS:  None since 2020  PATIENT SURVEYS:  LEFS 25/80 31.3 %   COGNITION: Overall cognitive status: Within functional limits for tasks assessed     SENSATION: Pins and needles in feet due to neuropathy  POSTURE: rounded shoulders and forward head  PALPATION: Soreness in  knees  LUMBAR ROM:   AROM eval  Flexion 65% available pulling  Extension 40% available  Right lateral flexion   Left lateral flexion   Right rotation   Left rotation    (Blank rows = not tested)  LOWER EXTREMITY ROM:     Active  Right eval Left eval  Hip flexion    Hip extension    Hip abduction    Hip adduction    Hip internal rotation    Hip external rotation    Knee flexion 118 124  Knee extension    Ankle dorsiflexion    Ankle plantarflexion    Ankle inversion    Ankle eversion     (Blank rows = not tested)  LOWER EXTREMITY MMT:    MMT Right eval Left eval Right 06/03/23 Left 06/03/23  Hip flexion 4 4- 4/5 4-/5  Hip extension 3+ 3+ 4/5 4-  Hip abduction 4 4 4/5 4/5  Hip adduction      Hip internal rotation      Hip external rotation      Knee flexion 4 4 4/5 4/5  Knee extension 4+ 4 4/5 4/5  Ankle dorsiflexion 4+ 4+ 4+ 4+  Ankle plantarflexion      Ankle inversion      Ankle eversion       (Blank rows = not tested)   FUNCTIONAL TESTS:  5 times sit to stand: 25.36 sec using hands to push up on knees; audible popping in knees  06/03/23: 5 STS 25" use of UE support for pain control   GAIT: Distance walked: 50 ft in clinic Assistive device utilized: None Level of assistance: Modified independence Comments: slow antalgic gait  TODAY'S TREATMENT:  DATE:  06/03/23: 5 STS  MMT 262ft no HHA Stairs 3 RT step to pattern LEFS  Mini squat front of chair 5x Tandem on foam 2x 30" intermittent HHA   05/27/23 Nustep x 5' dynamic warm up; seat 5;arms 5  Standing: Heel/toe raises x 20 Hip abduction 2 x 10 Hip extension 2 x 10 4" step ups 2 x 10 each with 1 UE assist Tandem stance 2 x 30" each  Sit to stand x 10 with no UE assist  Sitting: SAQ's 2# 2 x 10 Hamstring stretch with strap 10 x 10"   04/30/23: Reviewed  goals Educated importance of HEP compliance Pt reports compliance with HEP without questions  Sidelying: abduction 10x Prone: Hip extension 10x Knee flexion 10x Supine: bridge 10x Quad sets 10x BLE- c/o Lt patella pain during exercise Manual patella mobs all directions SLR 10x   STS 10x eccentric control   04/22/23 physical therapy evaluation and HEP instruction    PATIENT EDUCATION:  Education details: Patient educated on exam findings, POC, scope of PT, HEP, and what to expect next visit; benefits of aquatic exercise Person educated: Patient Education method: Explanation, Demonstration, and Handouts Education comprehension: verbalized understanding, returned demonstration, verbal cues required, and tactile cues required  HOME EXERCISE PROGRAM: 05/27/23 standing heel/toe raises, hip abduction and hip extension Access Code: [email or web address] URL: [email or web address] Date: 04/22/2023 Prepared by: [facility] - Rehab  Exercises - Supine Bridge  - 2 x daily - 7 x weekly - 1 sets - 10 reps - Supine Quad Set  - 2 x daily - 7 x weekly - 1 sets - 10 reps - Supine Lower Trunk Rotation  - 2 x daily - 7 x weekly - 1 sets - 10 reps  -Prone hip extension - STS  ASSESSMENT:  CLINICAL IMPRESSION: Reviewed goals due to cert date.  Pt limited by pain this session that limited her performance.  Pt feels she has improved by 10% since began therapy.  Reports compliance with HEP daily.  Pt continues to present weakness noted in gait mechanics, MMT, 5STS and subjective questioning with LEFS.  Improved self percieved functional abilities noted with LEFS increased to 34/80 was 25/80.  Pt will continue to benefit from skilled intervention to address goals not met.    Eval:  Patient is a 57 y.o. female who was seen today for physical therapy evaluation and treatment for polyarthritis. Patient demonstrates muscle weakness, reduced ROM, and fascial restrictions which are likely contributing to symptoms  of pain and are negatively impacting patient ability to perform ADLs and functional mobility tasks. Patient will benefit from skilled physical therapy services to address these deficits to reduce pain and improve level of function with ADLs and functional mobility tasks.   OBJECTIVE IMPAIRMENTS: Abnormal gait, decreased activity tolerance, decreased endurance, decreased mobility, difficulty walking, decreased ROM, decreased strength, increased fascial restrictions, impaired perceived functional ability, impaired flexibility, and pain.   ACTIVITY LIMITATIONS: carrying, lifting, bending, sitting, standing, squatting, sleeping, stairs, transfers, bed mobility, bathing, dressing, locomotion level, and caring for others  PARTICIPATION LIMITATIONS: meal prep, cleaning, laundry, shopping, community activity, and yard work   [facility] POTENTIAL: Good  CLINICAL DECISION MAKING: Stable/uncomplicated  EVALUATION COMPLEXITY: Low   GOALS: Goals reviewed with patient? No  SHORT TERM GOALS: Target date: 05/13/2023  patient will be independent with initial HEP  Baseline: 06/03/23:  Reports compliance every day.   Goal status: IN PROGRESS  2.  Patient will self report 30% improvement to improve tolerance for functional  activity  Baseline: 06/03/23:  Reports improvements by 10% easier to ascend stairs depending on the day Goal status: IN PROGRESS    LONG TERM GOALS: Target date: 06/03/2023  Patient will be independent in self management strategies to improve quality of life and functional outcomes.  Baseline:  Goal status: IN PROGRESS  2.  Patient will self report 50% improvement to improve tolerance for functional activity  Baseline: 06/03/23:  Reports improvements by 10% easier to ascend stairs depending on the day Goal status: IN PROGRESS  3.  Patient will increase bilateral  leg MMTs to 4+ to 5/5 without pain to promote return to ambulation community distances with minimal deviation.    Baseline: see above Goal status: IN PROGRESS  4.  Patient will improve/increase LEFS score by 10 points to demonstrate improved perceived functional mobility Baseline: 25/80; 06/03/23: 34/80 Goal status: IN PROGRESS  5.  Patient will improve 5 times sit to stand score from 25.36 sec to 20 sec to demonstrate improved functional mobility and increased lower extremity strength.  Baseline: 06/03/23:  5STS 28" with UE support due to increased knee pain. Goal status: IN PROGRESS   PLAN:  PT FREQUENCY: 1x/week  PT DURATION: 6 weeks  PLANNED INTERVENTIONS: Therapeutic exercises, Therapeutic activity, Neuromuscular re-education, Balance training, Gait training, Patient/Family education, Joint manipulation, Joint mobilization, Stair training, Orthotic/Fit training, DME instructions, Aquatic Therapy, Dry Needling, Electrical stimulation, Spinal manipulation, Spinal mobilization, Cryotherapy, Moist heat, Compression bandaging, scar mobilization, Splintting, Taping, Traction, Ultrasound, Ionotophoresis 4mg /ml Dexamethasone, and Manual therapy .  PLAN FOR NEXT SESSION: Progress functional strength; core strength; functional mobility and balance.   Squats; tandem stance on foam  Becky Sax, LPTA/CLT; CBIS [phone]  Juel Burrow, PTA 06/03/2023, 12:33 PM  12:33 PM, 06/03/23

## 2023-06-04 NOTE — Addendum Note (Signed)
Addended byWilhemena Durie S on: 06/04/2023 07:59 AM   Modules accepted: Orders

## 2023-06-10 ENCOUNTER — Ambulatory Visit (HOSPITAL_COMMUNITY): Payer: Medicaid Other

## 2023-06-14 ENCOUNTER — Ambulatory Visit (HOSPITAL_COMMUNITY): Payer: Medicaid Other | Attending: Internal Medicine

## 2023-06-14 ENCOUNTER — Encounter (HOSPITAL_COMMUNITY): Payer: Self-pay

## 2023-06-14 DIAGNOSIS — R262 Difficulty in walking, not elsewhere classified: Secondary | ICD-10-CM | POA: Diagnosis present

## 2023-06-14 DIAGNOSIS — M25561 Pain in right knee: Secondary | ICD-10-CM | POA: Diagnosis present

## 2023-06-14 DIAGNOSIS — G8929 Other chronic pain: Secondary | ICD-10-CM | POA: Insufficient documentation

## 2023-06-14 DIAGNOSIS — M25562 Pain in left knee: Secondary | ICD-10-CM | POA: Insufficient documentation

## 2023-06-14 DIAGNOSIS — M159 Polyosteoarthritis, unspecified: Secondary | ICD-10-CM | POA: Insufficient documentation

## 2023-06-14 NOTE — Therapy (Signed)
OUTPATIENT PHYSICAL THERAPY THORACOLUMBAR TREATMENT   Patient Name: TEYANA PIERRON MRN: [id number] DOB:10-Sep-1966, 57 y.o., female Today's Date: 06/14/2023  END OF SESSION:  PT End of Session - 06/14/23 1055     Visit Number 5    Number of Visits 6    Date for PT Re-Evaluation 06/20/23    Authorization Type [place] Medicaid [employer]    Authorization Time Period healthy blue approved 7 visits from 04/22/2023-06/20/2023    Authorization - Visit Number 4    Authorization - Number of Visits 7    Progress Note Due on Visit 7    PT Start Time 1053    PT Stop Time 1132    PT Time Calculation (min) 39 min    Activity Tolerance Patient tolerated treatment well    Behavior During Therapy WFL for tasks assessed/performed              Past Medical History:  Diagnosis Date   Anxiety    Asthma    Depression    Diabetes mellitus without complication (HCC)    Hypertension    Thyroid disease    Tobacco use    Past Surgical History:  Procedure Laterality Date   CESAREAN SECTION     CYSTOSCOPY W/ DILATION OF BLADDER     x2   ENDOMETRIAL ABLATION     HYSTEROSCOPY     TONSILLECTOMY     Patient Active Problem List   Diagnosis Date Noted   Cellulitis of abdominal wall 11/19/2018   Cellulitis of pubic region 06/18/2018   Hypertension 06/18/2018   Depression with anxiety 06/18/2018   Type 2 diabetes mellitus (HCC) 06/18/2018   Cellulitis 03/22/2018   Hypothyroidism 03/22/2018   Cellulitis and abscess of leg     PCP: Alvina Filbert, MD  REFERRING PROVIDER: Alvina Filbert, MD  REFERRING DIAG:  Diagnosis  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, unspecified    Rationale for Evaluation and Treatment: Rehabilitation  THERAPY DIAG:  Osteoarthritis of multiple joints, unspecified osteoarthritis type  Difficulty in walking, not elsewhere classified  Chronic pain of both knees  ONSET DATE: chronic; many years; since my 30's  SUBJECTIVE:                                                                                                                                                                                            SUBJECTIVE STATEMENT: 06/14/23: Reports she has had high pain in knees this past week, pain scale 7/10 today.  Unsure if weather plays a part in the pain.  Has been drinking more water to see if helps with cramps  Eval:  Patient reports  ongoing arthritis; recently able to get insurance so when she started to have increased pain and difficulty and so MD referred to therapy; most of her pain is in her knees; trouble walking; takes care of her son and has grandchildren that live next door she helps with.   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 Yes: NPRS scale: 7/10 Pain location: mostly hips and knees and legs Pain description: aching and sore Aggravating factors: prolonged standing and walking; prolonged sitting Relieving factors: injections helped some, ibuprofen, meloxicam  PRECAUTIONS: Fall  WEIGHT BEARING RESTRICTIONS: No  FALLS:  Has patient fallen in last 6 months? Yes. Number of falls 3  LIVING ENVIRONMENT: Lives with: lives with their son disables son she takes care of Lives in: House/apartment Stairs: Yes: Internal: 13 steps; on right going up, on left going up, and can reach both and External: 7 steps; on right going up, on left going up, and can reach both Has following equipment at home: None  OCCUPATION: disabled  PLOF: Independent with basic ADLs  PATIENT GOALS: keep up better with my grandchildren  NEXT MD VISIT: July  OBJECTIVE:   DIAGNOSTIC FINDINGS:  None since 2020  PATIENT SURVEYS:  LEFS 25/80 31.3 %   COGNITION: Overall cognitive status: Within functional limits for tasks assessed     SENSATION: Pins and needles in feet due to neuropathy  POSTURE: rounded shoulders and forward head  PALPATION: Soreness in knees  LUMBAR ROM:   AROM eval  Flexion  65% available pulling  Extension 40% available  Right lateral flexion   Left lateral flexion   Right rotation   Left rotation    (Blank rows = not tested)  LOWER EXTREMITY ROM:     Active  Right eval Left eval  Hip flexion    Hip extension    Hip abduction    Hip adduction    Hip internal rotation    Hip external rotation    Knee flexion 118 124  Knee extension    Ankle dorsiflexion    Ankle plantarflexion    Ankle inversion    Ankle eversion     (Blank rows = not tested)  LOWER EXTREMITY MMT:    MMT Right eval Left eval Right 06/03/23 Left 06/03/23  Hip flexion 4 4- 4/5 4-/5  Hip extension 3+ 3+ 4/5 4-  Hip abduction 4 4 4/5 4/5  Hip adduction      Hip internal rotation      Hip external rotation      Knee flexion 4 4 4/5 4/5  Knee extension 4+ 4 4/5 4/5  Ankle dorsiflexion 4+ 4+ 4+ 4+  Ankle plantarflexion      Ankle inversion      Ankle eversion       (Blank rows = not tested)   FUNCTIONAL TESTS:  5 times sit to stand: 25.36 sec using hands to push up on knees; audible popping in knees  06/03/23: 5 STS 25" use of UE support for pain control   GAIT: Distance walked: 50 ft in clinic Assistive device utilized: None Level of assistance: Modified independence Comments: slow antalgic gait  TODAY'S TREATMENT:  DATE:  06/14/23: Nustep x 5' dynamic warm up; seat 5;arms 5  Standing: Heel/toe raises x 20 Mini squat front of chair 5x 2 sets Tandem on foam 2x 30" intermittent HHA Forward step up 6in 2HHA Lateral step up 4in 2 HHA Hip abduction 2 x 10 Hip extension 2 x 10   06/03/23: 5 STS  MMT 289ft no HHA Stairs 3 RT step to pattern LEFS  Mini squat front of chair 5x Tandem on foam 2x 30" intermittent HHA   05/27/23 Nustep x 5' dynamic warm up; seat 5;arms 5  Standing: Heel/toe raises x 20 Hip abduction 2 x 10 Hip  extension 2 x 10 4" step ups 2 x 10 each with 1 UE assist Tandem stance 2 x 30" each  Sit to stand x 10 with no UE assist  Sitting: SAQ's 2# 2 x 10 Hamstring stretch with strap 10 x 10"   04/30/23: Reviewed goals Educated importance of HEP compliance Pt reports compliance with HEP without questions  Sidelying: abduction 10x Prone: Hip extension 10x Knee flexion 10x Supine: bridge 10x Quad sets 10x BLE- c/o Lt patella pain during exercise Manual patella mobs all directions SLR 10x   STS 10x eccentric control   04/22/23 physical therapy evaluation and HEP instruction    PATIENT EDUCATION:  Education details: Patient educated on exam findings, POC, scope of PT, HEP, and what to expect next visit; benefits of aquatic exercise Person educated: Patient Education method: Explanation, Demonstration, and Handouts Education comprehension: verbalized understanding, returned demonstration, verbal cues required, and tactile cues required  HOME EXERCISE PROGRAM: 05/27/23 standing heel/toe raises, hip abduction and hip extension Access Code: [email or web address] URL: [email or web address] Date: 04/22/2023 Prepared by: [facility] - Rehab  Exercises - Supine Bridge  - 2 x daily - 7 x weekly - 1 sets - 10 reps - Supine Quad Set  - 2 x daily - 7 x weekly - 1 sets - 10 reps - Supine Lower Trunk Rotation  - 2 x daily - 7 x weekly - 1 sets - 10 reps  -Prone hip extension - STS  ASSESSMENT:  CLINICAL IMPRESSION: Session focus with functional strengthening.  Able to increase step up height with minimal difficulty.  Pt limited by pain that was monitored through session, tolerated well with no reports of increased pain.  Min cueing for proper mechanics with squat.  Intermittent HHA required with tandem stance.  Eval:  Patient is a 57 y.o. female who was seen today for physical therapy evaluation and treatment for polyarthritis. Patient demonstrates muscle weakness, reduced ROM, and fascial  restrictions which are likely contributing to symptoms of pain and are negatively impacting patient ability to perform ADLs and functional mobility tasks. Patient will benefit from skilled physical therapy services to address these deficits to reduce pain and improve level of function with ADLs and functional mobility tasks.   OBJECTIVE IMPAIRMENTS: Abnormal gait, decreased activity tolerance, decreased endurance, decreased mobility, difficulty walking, decreased ROM, decreased strength, increased fascial restrictions, impaired perceived functional ability, impaired flexibility, and pain.   ACTIVITY LIMITATIONS: carrying, lifting, bending, sitting, standing, squatting, sleeping, stairs, transfers, bed mobility, bathing, dressing, locomotion level, and caring for others  PARTICIPATION LIMITATIONS: meal prep, cleaning, laundry, shopping, community activity, and yard work   [facility] POTENTIAL: Good  CLINICAL DECISION MAKING: Stable/uncomplicated  EVALUATION COMPLEXITY: Low   GOALS: Goals reviewed with patient? No  SHORT TERM GOALS: Target date: 05/13/2023  patient will be independent with initial HEP  Baseline: 06/03/23:  Reports compliance every  day.   Goal status: IN PROGRESS  2.  Patient will self report 30% improvement to improve tolerance for functional activity  Baseline: 06/03/23:  Reports improvements by 10% easier to ascend stairs depending on the day Goal status: IN PROGRESS    LONG TERM GOALS: Target date: 06/03/2023  Patient will be independent in self management strategies to improve quality of life and functional outcomes.  Baseline:  Goal status: IN PROGRESS  2.  Patient will self report 50% improvement to improve tolerance for functional activity  Baseline: 06/03/23:  Reports improvements by 10% easier to ascend stairs depending on the day Goal status: IN PROGRESS  3.  Patient will increase bilateral  leg MMTs to 4+ to 5/5 without pain to promote return to  ambulation community distances with minimal deviation.   Baseline: see above Goal status: IN PROGRESS  4.  Patient will improve/increase LEFS score by 10 points to demonstrate improved perceived functional mobility Baseline: 25/80; 06/03/23: 34/80 Goal status: IN PROGRESS  5.  Patient will improve 5 times sit to stand score from 25.36 sec to 20 sec to demonstrate improved functional mobility and increased lower extremity strength.  Baseline: 06/03/23:  5STS 28" with UE support due to increased knee pain. Goal status: IN PROGRESS   PLAN:  PT FREQUENCY: 1x/week  PT DURATION: 6 weeks  PLANNED INTERVENTIONS: Therapeutic exercises, Therapeutic activity, Neuromuscular re-education, Balance training, Gait training, Patient/Family education, Joint manipulation, Joint mobilization, Stair training, Orthotic/Fit training, DME instructions, Aquatic Therapy, Dry Needling, Electrical stimulation, Spinal manipulation, Spinal mobilization, Cryotherapy, Moist heat, Compression bandaging, scar mobilization, Splintting, Taping, Traction, Ultrasound, Ionotophoresis 4mg /ml Dexamethasone, and Manual therapy .  PLAN FOR NEXT SESSION: Progress functional strength; core strength; functional mobility and balance.   Squats; tandem stance on foam  Becky Sax, LPTA/CLT; CBIS [phone]  Juel Burrow, PTA 06/14/2023, 2:15 PM  2:15 PM, 06/14/23

## 2023-06-20 ENCOUNTER — Encounter (HOSPITAL_COMMUNITY): Payer: Medicaid Other

## 2023-08-14 DIAGNOSIS — E119 Type 2 diabetes mellitus without complications: Secondary | ICD-10-CM | POA: Insufficient documentation

## 2023-08-16 ENCOUNTER — Other Ambulatory Visit: Payer: Self-pay

## 2023-08-16 ENCOUNTER — Other Ambulatory Visit (INDEPENDENT_AMBULATORY_CARE_PROVIDER_SITE_OTHER): Payer: Medicaid Other

## 2023-08-16 ENCOUNTER — Ambulatory Visit: Payer: Medicaid Other | Admitting: Orthopedic Surgery

## 2023-08-16 ENCOUNTER — Encounter: Payer: Self-pay | Admitting: Orthopedic Surgery

## 2023-08-16 VITALS — BP 126/79 | HR 74 | Ht <= 58 in | Wt 181.8 lb

## 2023-08-16 DIAGNOSIS — G8929 Other chronic pain: Secondary | ICD-10-CM

## 2023-08-16 DIAGNOSIS — M170 Bilateral primary osteoarthritis of knee: Secondary | ICD-10-CM | POA: Diagnosis not present

## 2023-08-16 NOTE — Patient Instructions (Signed)

## 2023-08-16 NOTE — Progress Notes (Signed)
New Patient Visit  Assessment: Maria Vargas is a 57 y.o. female with the following: 1. Primary osteoarthritis of both knees  Plan: Maria Vargas has progressively worsening degenerative changes in bilateral knees.  She has varus alignment on her leg.  Radiographs demonstrate more advanced disease within the left knee, and her symptoms are consistent.  She is a diabetic, so I am reluctant to inject both of her knees.  However, I did offer her an injection for the left knee.  She elected proceed.  If she has good response, and she would like to proceed with an injection of the right knee, she will contact the clinic.  Otherwise, continue with meloxicam as needed.  Exercise and continue weight loss.  Procedure note injection Left knee joint   Verbal consent was obtained to inject the left knee joint  Timeout was completed to confirm the site of injection.  The skin was prepped with alcohol and ethyl chloride was sprayed at the injection site.  A 21-gauge needle was used to inject 40 mg of Depo-Medrol and 1% lidocaine (4 cc) into the left knee using an anterolateral approach.  There were no complications. A sterile bandage was applied.     Follow-up: No follow-ups on file.  Subjective:  Chief Complaint  Patient presents with   Knee Pain    Bil knees for yrs L > R but worse in the past yrs. Has had multiple falls in the past yrs due to knees giving way.     History of Present Illness: Maria Vargas is a 57 y.o. female who has been referred by Alvina Filbert, MD for evaluation of bilateral knee pain.  She has had progressively worsening bilateral knee pain for years.  She states that she was diagnosed with arthritis many years ago.  Over the past year, her pain is getting much worse.  She notes buckling sensations due to the pain, which has caused her to fall.  She has recently started Ozempic, and has lost some weight.  She does not notice a difference in her knee pain since she  started losing weight.  She is taking meloxicam.  She has never had an injection.  She has done physical therapy, continues to do the exercises that were provided for her.   Review of Systems: No fevers or chills No numbness or tingling No chest pain No shortness of breath No bowel or bladder dysfunction No GI distress No headaches   Medical History:  Past Medical History:  Diagnosis Date   Anxiety    Asthma    Depression    Diabetes mellitus without complication (HCC)    Hypertension    Thyroid disease    Tobacco use     Past Surgical History:  Procedure Laterality Date   CESAREAN SECTION     CYSTOSCOPY W/ DILATION OF BLADDER     x2   ENDOMETRIAL ABLATION     HYSTEROSCOPY     TONSILLECTOMY      Family History  Problem Relation Age of Onset   Diabetes Mellitus II Mother    Hypertension Mother    Hypertension Father    Arthritis Father    Heart disease Paternal Grandmother    Brain cancer Maternal Grandfather    Parkinson's disease Maternal Uncle    Alzheimer's disease Paternal Aunt    Social History   Tobacco Use   Smoking status: Every Day    Current packs/day: 1.00    Types: Cigarettes   Smokeless  tobacco: Never  Vaping Use   Vaping status: Never Used  Substance Use Topics   Alcohol use: Yes    Comment: rarely   Drug use: Never    Allergies  Allergen Reactions   Clindamycin/Lincomycin Rash   Amoxicillin-Pot Clavulanate Other (See Comments)    Bad yeast infection   Ultram [Tramadol Hcl] Nausea And Vomiting    No outpatient medications have been marked as taking for the 08/16/23 encounter (Office Visit) with Oliver Barre, MD.    Objective: BP 126/79   Pulse 74   Ht 4\' 10"  (1.473 m)   Wt 181 lb 12.8 oz (82.5 kg)   BMI 38.00 kg/m   Physical Exam:  General: Alert and oriented. and No acute distress. Gait: Left sided antalgic gait.  Bilateral knees with varus alignment overall.  These deformities are not correctable.  She has crepitus  with range of motion testing.  She can achieve full extension.  No increased laxity varus valgus stress.  Negative Lachman.  IMAGING: I personally ordered and reviewed the following images   X-rays of bilateral knees were obtained in clinic today.  Injuries are noted.  Varus alignment overall.  Near complete loss of joint space within the medial compartment bilaterally, with left slightly worse than right.  There are osteophytes noted within the patellofemoral compartment.  Impression: Bilateral knee x-rays with advanced degenerative changes, left slightly worse than the right   New Medications:  No orders of the defined types were placed in this encounter.     Oliver Barre, MD  08/16/2023 9:36 AM

## 2024-05-01 ENCOUNTER — Ambulatory Visit (INDEPENDENT_AMBULATORY_CARE_PROVIDER_SITE_OTHER): Admitting: Orthopedic Surgery

## 2024-05-01 VITALS — BP 134/80 | HR 84 | Ht <= 58 in | Wt 181.0 lb

## 2024-05-01 DIAGNOSIS — M170 Bilateral primary osteoarthritis of knee: Secondary | ICD-10-CM | POA: Diagnosis not present

## 2024-05-01 NOTE — Patient Instructions (Signed)

## 2024-05-01 NOTE — Progress Notes (Signed)
 Return patient Visit  Assessment: Maria Vargas is a 58 y.o. female with the following: 1. Primary osteoarthritis of both knees  Plan: Maria Vargas has progressively worsening degenerative changes in bilateral knees.  She has known bilateral knee arthritis.  Prior left knee injection was very effective.  She is now having pain in both of her knees.  She states that her glycemic control is much better.  She is interested in injections in both her knees.  These were completed in clinic today.   Procedure note injection Left knee joint   Verbal consent was obtained to inject the left knee joint  Timeout was completed to confirm the site of injection.  The skin was prepped with alcohol and ethyl chloride was sprayed at the injection site.  A 21-gauge needle was used to inject 40 mg of Depo-Medrol and 1% lidocaine  (4 cc) into the left knee using an anterolateral approach.  There were no complications. A sterile bandage was applied.    Procedure note injection Right knee joint   Verbal consent was obtained to inject the right knee joint  Timeout was completed to confirm the site of injection.  The skin was prepped with alcohol and ethyl chloride was sprayed at the injection site.  A 21-gauge needle was used to inject 40 mg of Depo-Medrol and 1% lidocaine  (4 cc) into the right knee using an anterolateral approach.  There were no complications. A sterile bandage was applied.    Follow-up: Return if symptoms worsen or fail to improve.  Subjective:  Chief Complaint  Patient presents with   Injections    B knees NDC: [phone]    History of Present Illness: Maria Vargas is a 58 y.o. female who returns to clinic for repeat evaluation of bilateral knee pain.  I saw her greater than 6 months ago, and injected the left knee.  This was very effective.  She started to have progressively worsening pain.  She is now complaining of pain in both of her knees.  She states that her  diabetes is under more control.  She is interested in injections in both her knees.   Review of Systems: No fevers or chills No numbness or tingling No chest pain No shortness of breath No bowel or bladder dysfunction No GI distress No headaches     Objective: BP 134/80   Pulse 84   Ht 4' 10 (1.473 m)   Wt 181 lb (82.1 kg)   BMI 37.83 kg/m   Physical Exam:  General: Alert and oriented. and No acute distress. Gait: Left sided antalgic gait.  Bilateral knees with varus alignment overall.  These deformities are not correctable.  She has crepitus with range of motion testing.  She can achieve full extension.  No increased laxity varus valgus stress.  Negative Lachman.  IMAGING: No new imaging obtained today    New Medications:  No orders of the defined types were placed in this encounter.     Oneil DELENA Horde, MD  05/02/2024 8:54 AM

## 2024-05-08 ENCOUNTER — Other Ambulatory Visit (INDEPENDENT_AMBULATORY_CARE_PROVIDER_SITE_OTHER): Payer: Self-pay

## 2024-05-08 ENCOUNTER — Ambulatory Visit: Admitting: Orthopedic Surgery

## 2024-05-08 ENCOUNTER — Encounter: Payer: Self-pay | Admitting: Orthopedic Surgery

## 2024-05-08 VITALS — BP 111/72 | HR 76

## 2024-05-08 DIAGNOSIS — M25511 Pain in right shoulder: Secondary | ICD-10-CM

## 2024-05-08 DIAGNOSIS — G8929 Other chronic pain: Secondary | ICD-10-CM

## 2024-05-08 NOTE — Patient Instructions (Signed)

## 2024-05-08 NOTE — Progress Notes (Signed)
 Orthopaedic Clinic Return  Assessment: Maria Vargas is a 58 y.o. female with the following: Chronic right shoulder pain  Plan: Mrs. Stair is having pain in the right shoulder.  No specific injury.  It has been intermittent for at least a year.  It is more consistent now.  On exam, she has good range of motion and good strength.  She has some irritation of the anterior rotator cuff, as well as the biceps tendon.  Radiographs demonstrates minimal to mild glenohumeral joint arthritis.  We discussed an injection.  She elected to proceed.   Procedure note injection - Right shoulder    Verbal consent was obtained to inject the right shoulder, subacromial space Timeout was completed to confirm the site of injection.   The skin was prepped with alcohol and ethyl chloride was sprayed at the injection site.  A 21-gauge needle was used to inject 40 mg of Depo-Medrol and 1% lidocaine  (4 cc) into the subacromial space of the right shoulder using a posterolateral approach.  There were no complications.  A sterile bandage was applied.     Follow-up: Return if symptoms worsen or fail to improve.   Subjective:  Chief Complaint  Patient presents with   Pain    Right shoulder pain- hurts when lays on it.when lifting shoulder overhead it hurts. Off and on for 1 year and gradually increases.     History of Present Illness: Maria Vargas is a 58 y.o. female who returns to clinic for evaluation of right shoulder pain.  I have seen her in the past for both knees.  Her knees are doing better.  She has had pain in the right shoulder for about a year.  It has been intermittent.  More recently, and is more consistent.  No specific injury.  She has pain with some motion.  She is complaining of pain over the anterior shoulder, radiating into the biceps.  She takes  Tylenol  or ibuprofen  on a regular basis.  No prior injections.  Review of Systems: No fevers or chills No numbness or tingling No chest  pain No shortness of breath No bowel or bladder dysfunction No GI distress No headaches   Objective: BP 111/72   Pulse 76   Physical Exam:  Alert and oriented.  No acute distress.  Right shoulder without deformity.  Tenderness over the anterior shoulder.  She has full range of motion.  Some discomfort with supraspinatus strength testing.  Positive Jobe's.  5/5 strength throughout the right upper extremity.  Negative belly press.  Positive O'Brien's.  IMAGING: I personally ordered and reviewed the following images:  X-rays of the right shoulder were obtained in clinic today.  No acute injuries are noted.  Mild loss of joint space within the glenohumeral joint.  No osteophytes.  No proximal humeral migration.  Mild degenerative changes noted at the [facility] joint.  No bony lesions.  Impression: Negative right shoulder x-ray  Maria Vargas Horde, MD 05/08/2024 9:55 AM

## 2024-06-24 ENCOUNTER — Telehealth: Payer: Self-pay | Admitting: Orthopedic Surgery

## 2024-06-24 NOTE — Telephone Encounter (Signed)
 Dr. Onesimo pt - pt lvm stating she was having a sharp pain in her lt knee, wants suggestions on what to do to help it. [phone]

## 2024-06-25 NOTE — Telephone Encounter (Signed)
 Pt c/o sharp pain in L knee with quick pivoting movements for the past 1-2 days. Suggested pt add some tylenol , using her brace, as well as icing for the weekend. Asked pt to send a MyChart message next week if no improvement. Pt verbalized understanding.

## 2024-08-01 ENCOUNTER — Emergency Department (HOSPITAL_COMMUNITY)

## 2024-08-01 ENCOUNTER — Other Ambulatory Visit: Payer: Self-pay

## 2024-08-01 ENCOUNTER — Encounter (HOSPITAL_COMMUNITY): Payer: Self-pay | Admitting: Emergency Medicine

## 2024-08-01 ENCOUNTER — Emergency Department (HOSPITAL_COMMUNITY)
Admission: EM | Admit: 2024-08-01 | Discharge: 2024-08-01 | Disposition: A | Discharge status: Home / Self Care | Attending: Emergency Medicine | Admitting: Emergency Medicine

## 2024-08-01 DIAGNOSIS — M5412 Radiculopathy, cervical region: Secondary | ICD-10-CM | POA: Diagnosis not present

## 2024-08-01 DIAGNOSIS — M542 Cervicalgia: Secondary | ICD-10-CM | POA: Diagnosis present

## 2024-08-01 LAB — CBG MONITORING, ED: Glucose-Capillary: 88 mg/dL (ref 70–99)

## 2024-08-01 MED ORDER — METHYLPREDNISOLONE 4 MG PO TBPK: ORAL_TABLET | ORAL | 0 refills | Status: AC

## 2024-08-01 MED ORDER — METHOCARBAMOL 750 MG PO TABS: 750.0000 mg | ORAL_TABLET | Freq: Three times a day (TID) | ORAL | 0 refills | Status: DC

## 2024-08-01 NOTE — ED Provider Notes (Signed)
 [place] EMERGENCY DEPARTMENT AT [facility] Provider Note   CSN: [id number] Arrival date & time: 08/01/24  9049     Patient presents with: Shoulder Pain   Maria Vargas is a 58 y.o. female.   Patient is a 58 year old female who presents emergency department chief complaint of pain to the right shoulder neck which has been ongoing for approximate the past 2 weeks.  She notes that she was initially having pain along both sides of her neck but notes that this has resolved.  She notes that she does get intermittent pain radiating down to the elbow.  She denies any numbness or paresthesias.  She does note that she was seen in urgent care and given a shot of steroids as well as NSAIDs and Flexeril .  She notes that she has had no improvement in her symptoms.  Patient notes that she has previously seen orthopedics who did perform an injection in her shoulder with no improvement in her symptoms as well.  Patient notes that she has had no redness, swelling, warmth to the shoulder.   Shoulder Pain      Prior to Admission medications   Medication Sig Start Date End Date Taking? Authorizing Provider  lisinopril  (PRINIVIL ,ZESTRIL ) 20 MG tablet Take 20 mg by mouth daily. 05/07/18   [provider]  loratadine (CLARITIN) 10 MG tablet Take 10 mg by mouth daily.    [provider]  meloxicam (MOBIC) 7.5 MG tablet Take 7.5 mg by mouth daily. 12/18/22   [provider]  metFORMIN  (GLUCOPHAGE ) 500 MG tablet Take 1 tablet (500 mg total) by mouth 2 (two) times daily with a meal. Patient not taking: Reported on 05/08/2024 03/26/18 01/04/23  Vonzell Loving, MD  Omega-3 Fatty Acids (FISH OIL PO) Take 1 capsule by mouth daily.    [provider]  rosuvastatin (CRESTOR) 5 MG tablet Take 5 mg by mouth daily. 11/20/22   [provider]  Semaglutide,0.25 or 0.5MG /DOS, (OZEMPIC, 0.25 OR 0.5 MG/DOSE,) 2 MG/3ML SOPN Inject 0.5 mg into the skin every Friday.  10/31/22   [provider]  sertraline  (ZOLOFT ) 50 MG tablet Take 25 mg by mouth every morning.    [provider]  trimethoprim -polymyxin b (POLYTRIM) ophthalmic solution Place 1 drop into both eyes in the morning, at noon, in the evening, and at bedtime. 12/28/22   [provider]    Allergies: Clindamycin /lincomycin, Amoxicillin-pot clavulanate, and Ultram [tramadol hcl]    Review of Systems  Musculoskeletal:        Right shoulder pain  All other systems reviewed and are negative.   Updated Vital Signs BP 120/73 (BP Location: Left Arm)   Pulse 82   Temp 98.3 F (36.8 C) (Oral)   Resp 17   Ht 4' 10 (1.473 m)   Wt 82.6 kg   SpO2 98%   BMI 38.04 kg/m   Physical Exam Vitals and nursing note reviewed.  Constitutional:      Appearance: Normal appearance.  HENT:     Head: Normocephalic and atraumatic.  Eyes:     Extraocular Movements: Extraocular movements intact.     Conjunctiva/sclera: Conjunctivae normal.     Pupils: Pupils are equal, round, and reactive to light.  Neck:     Comments: Tender to palpation along the right lateral aspect of the neck and right trapezius muscle, no midline tenderness, no step-off or deformity Cardiovascular:     Rate and Rhythm: Normal rate and regular rhythm.  Pulses: Normal pulses.     Heart sounds: Normal heart sounds. No murmur heard.    No gallop.  Pulmonary:     Effort: Pulmonary effort is normal. No respiratory distress.     Breath sounds: Normal breath sounds. No stridor. No wheezing, rhonchi or rales.  Musculoskeletal:        General: Normal range of motion.     Cervical back: Normal range of motion and neck supple.     Comments: Tenderness palpation noted over the posterior aspect of the right shoulder, nontender palpation over remainder of right upper extremity, radial pulse 2+ distally, sensation intact distally, radial, ulnar, median, axillary nerve function intact distally, no obvious deformity or  bruising, no skin breakdown or ulceration, no lacerations or abrasions, full range of motion noted throughout, no overlying erythema or warmth  Skin:    General: Skin is warm and dry.  Neurological:     General: No focal deficit present.     Mental Status: She is alert and oriented to person, place, and time. Mental status is at baseline.  Psychiatric:        Mood and Affect: Mood normal.        Behavior: Behavior normal.        Thought Content: Thought content normal.        Judgment: Judgment normal.     (all labs ordered are listed, but only abnormal results are displayed) Labs Reviewed  CBG MONITORING, ED    EKG: None  Radiology: No results found.   Procedures   Medications Ordered in the ED - No data to display                                  Medical Decision Making  This patient presents to the ED for concern of right shoulder pain and neck pain differential diagnosis includes cervical radiculopathy, muscle strain, cervical disc protrusion, septic joint, gout    Additional history obtained:  Additional history obtained from medical records External records from outside source obtained and reviewed including medical records   Lab Tests:  I Ordered, and personally interpreted labs.  The pertinent results include: Normal glucose    Problem List / ED Course:  Patient is doing well at this time and is stable for discharge home.  Discussed with patient symptoms are most consistent with cervical radiculopathy at this time.  Do not suspect imaging is warranted.  Low suspicion for line etiology such as vertebral osteomyelitis, epidural abscess.  She has no concerning neurological deficits at this point.  Patient has Strong peripheral pulses with no indication for acute arterial occlusion.  She has no clinical indication for septic joint or gout.  Patient's glucose is stable at this point.  Discussed that we will treat with a short course of steroids and she was  directed to monitor her glucose at home.  Will change her muscle relaxer as well.  Close follow-up with spine was discussed.  Strict turn precautions were provided for any new or worsening symptoms.  Patient voiced understanding and had no additional questions.   Social Determinants of Health:  None        Final diagnoses:  None    ED Discharge Orders     None          Daralene Lonni JONETTA DEVONNA 08/01/24 1112    Cleotilde Rogue, MD 08/04/24 [phone]

## 2024-08-01 NOTE — ED Triage Notes (Signed)
 Pt to the ED with complaints of right shoulder pain for the past two weeks.  Pt has been getting injections with ortho and been seen by UC with no relief.

## 2024-08-01 NOTE — ED Notes (Signed)
 CBG 88

## 2024-08-01 NOTE — Discharge Instructions (Signed)
 Please follow-up with your primary care doctor and neurosurgery closely on an outpatient basis.  Please monitor your blood sugar closely as well while you are taking steroid.  Return to emergency department immediately for any new or worsening symptoms.

## 2024-09-01 ENCOUNTER — Encounter: Payer: Self-pay | Admitting: Orthopedic Surgery

## 2024-09-01 ENCOUNTER — Ambulatory Visit: Admitting: Orthopedic Surgery

## 2024-09-01 DIAGNOSIS — M170 Bilateral primary osteoarthritis of knee: Secondary | ICD-10-CM | POA: Diagnosis not present
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
## 2024-09-01 NOTE — Patient Instructions (Signed)

## 2024-09-11 ENCOUNTER — Ambulatory Visit: Admitting: Orthopedic Surgery

## 2024-09-11 ENCOUNTER — Encounter: Payer: Self-pay | Admitting: Orthopedic Surgery

## 2024-09-11 ENCOUNTER — Other Ambulatory Visit (INDEPENDENT_AMBULATORY_CARE_PROVIDER_SITE_OTHER): Payer: Self-pay

## 2024-09-11 VITALS — BP 122/72 | HR 78 | Ht <= 58 in | Wt 183.0 lb

## 2024-09-11 DIAGNOSIS — M542 Cervicalgia: Secondary | ICD-10-CM

## 2024-09-11 DIAGNOSIS — M47812 Spondylosis without myelopathy or radiculopathy, cervical region: Secondary | ICD-10-CM

## 2024-09-11 MED ORDER — METHOCARBAMOL 750 MG PO TABS: 750.0000 mg | ORAL_TABLET | Freq: Three times a day (TID) | ORAL | 0 refills | Status: AC
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
## 2024-09-30 ENCOUNTER — Other Ambulatory Visit: Payer: Self-pay

## 2024-09-30 ENCOUNTER — Ambulatory Visit (HOSPITAL_COMMUNITY): Attending: Neurosurgery

## 2024-09-30 DIAGNOSIS — M5412 Radiculopathy, cervical region: Secondary | ICD-10-CM | POA: Insufficient documentation

## 2024-09-30 DIAGNOSIS — M542 Cervicalgia: Secondary | ICD-10-CM | POA: Diagnosis present

## 2024-09-30 DIAGNOSIS — M6281 Muscle weakness (generalized): Secondary | ICD-10-CM | POA: Insufficient documentation
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
## 2024-10-12 ENCOUNTER — Encounter (HOSPITAL_COMMUNITY): Payer: Self-pay

## 2024-10-12 ENCOUNTER — Ambulatory Visit (HOSPITAL_COMMUNITY)

## 2024-10-26 ENCOUNTER — Telehealth (HOSPITAL_COMMUNITY): Payer: Self-pay

## 2024-10-26 ENCOUNTER — Ambulatory Visit (HOSPITAL_COMMUNITY)

## 2024-10-26 NOTE — Telephone Encounter (Signed)
 No show #1; called and left message regarding missed appt and reminder for upcoming appt.  10:55 AM, 10/26/2024 Maraki Macquarrie Small Akia Montalban MPT  physical therapy [place] [phone]

## 2024-11-10 ENCOUNTER — Telehealth (HOSPITAL_COMMUNITY): Payer: Self-pay

## 2024-11-10 ENCOUNTER — Ambulatory Visit (HOSPITAL_COMMUNITY)

## 2024-11-10 NOTE — Telephone Encounter (Signed)
 2nd no show, called and left message concerning missed apt today. Reminded next apt date and time with contact information included is needs to cancel/reschedule future apts. Included details on no show policy in message.   Augustin Mclean, LPTA/CLT; [facility] [phone]

## 2024-11-17 ENCOUNTER — Ambulatory Visit (HOSPITAL_COMMUNITY): Attending: Neurosurgery

## 2024-11-24 ENCOUNTER — Ambulatory Visit (HOSPITAL_COMMUNITY)

## 2024-12-02 ENCOUNTER — Ambulatory Visit (HOSPITAL_COMMUNITY)
